# Patient Record
Sex: Male | Born: 1975 | Race: White | Hispanic: No | Marital: Single | Smoking: Never smoker
Health system: Southern US, Community
[De-identification: ages and names within clinical notes are randomized; demographics above are authoritative.]

## PROBLEM LIST (undated history)

## (undated) DIAGNOSIS — K219 Gastro-esophageal reflux disease without esophagitis: Secondary | ICD-10-CM

## (undated) HISTORY — PX: ADENOIDECTOMY: SUR15

## (undated) HISTORY — DX: Gastro-esophageal reflux disease without esophagitis: K21.9

---

## 2004-10-31 HISTORY — PX: OTHER SURGICAL HISTORY: SHX169

## 2004-11-30 ENCOUNTER — Ambulatory Visit: Payer: Self-pay | Admitting: Internal Medicine

## 2005-03-14 ENCOUNTER — Ambulatory Visit: Payer: Self-pay | Admitting: Internal Medicine

## 2005-03-23 ENCOUNTER — Ambulatory Visit: Payer: Self-pay | Admitting: Internal Medicine

## 2005-07-12 ENCOUNTER — Ambulatory Visit: Payer: Self-pay | Admitting: Internal Medicine

## 2005-07-13 ENCOUNTER — Ambulatory Visit: Payer: Self-pay | Admitting: Internal Medicine

## 2005-07-24 ENCOUNTER — Encounter: Admission: RE | Admit: 2005-07-24 | Discharge: 2005-07-24 | Payer: Self-pay | Admitting: Internal Medicine

## 2005-08-19 ENCOUNTER — Ambulatory Visit: Payer: Self-pay | Admitting: Internal Medicine

## 2005-11-28 ENCOUNTER — Ambulatory Visit: Payer: Self-pay | Admitting: Internal Medicine

## 2006-04-26 ENCOUNTER — Emergency Department (HOSPITAL_COMMUNITY): Admission: EM | Admit: 2006-04-26 | Discharge: 2006-04-26 | Payer: Self-pay | Admitting: Family Medicine

## 2006-06-07 ENCOUNTER — Ambulatory Visit: Payer: Self-pay | Admitting: Internal Medicine

## 2006-07-19 ENCOUNTER — Ambulatory Visit: Payer: Self-pay | Admitting: Internal Medicine

## 2007-03-06 ENCOUNTER — Emergency Department (HOSPITAL_COMMUNITY): Admission: EM | Admit: 2007-03-06 | Discharge: 2007-03-07 | Payer: Self-pay | Admitting: *Deleted

## 2007-06-07 ENCOUNTER — Encounter: Payer: Self-pay | Admitting: Internal Medicine

## 2008-09-18 ENCOUNTER — Ambulatory Visit: Payer: Self-pay | Admitting: Internal Medicine

## 2008-09-18 DIAGNOSIS — R634 Abnormal weight loss: Secondary | ICD-10-CM

## 2008-09-23 ENCOUNTER — Encounter (INDEPENDENT_AMBULATORY_CARE_PROVIDER_SITE_OTHER): Payer: Self-pay | Admitting: *Deleted

## 2008-09-23 ENCOUNTER — Ambulatory Visit: Payer: Self-pay | Admitting: Internal Medicine

## 2008-09-23 LAB — CONVERTED CEMR LAB
OCCULT 1: NEGATIVE
OCCULT 2: NEGATIVE
OCCULT 3: NEGATIVE

## 2008-10-01 ENCOUNTER — Encounter (INDEPENDENT_AMBULATORY_CARE_PROVIDER_SITE_OTHER): Payer: Self-pay | Admitting: *Deleted

## 2008-10-01 LAB — CONVERTED CEMR LAB
ALT: 23 units/L (ref 0–53)
Calcium: 9.7 mg/dL (ref 8.4–10.5)
Creatinine, Ser: 1 mg/dL (ref 0.4–1.5)
Free T4: 0.9 ng/dL (ref 0.6–1.6)
GFR calc non Af Amer: 91 mL/min
HCT: 42.6 % (ref 39.0–52.0)
HDL: 49 mg/dL (ref >39.0)
LDL Cholesterol: 67 mg/dL (ref 0–99)
Lymphocytes Relative: 28 % (ref 12.0–46.0)
MCHC: 34.7 g/dL (ref 30.0–36.0)
Monocytes Absolute: 0.4 10*3/uL (ref 0.1–1.0)
Monocytes Relative: 8.8 % (ref 3.0–12.0)
Platelets: 243 10*3/uL (ref 150–400)
RDW: 12.6 % (ref 11.5–14.6)
TSH: 3.29 microintl units/mL (ref 0.35–5.50)
Total Bilirubin: 1.1 mg/dL (ref 0.3–1.2)
Triglycerides: 58 mg/dL (ref 0–149)

## 2010-04-14 ENCOUNTER — Ambulatory Visit: Payer: Self-pay | Admitting: Internal Medicine

## 2010-11-28 LAB — CONVERTED CEMR LAB
Alkaline Phosphatase: 73 units/L (ref 39–117)
BUN: 9 mg/dL (ref 6–23)
Basophils Relative: 1.1 % (ref 0.0–3.0)
Bilirubin, Direct: 0.2 mg/dL (ref 0.0–0.3)
Chloride: 101 meq/L (ref 96–112)
Cholesterol: 128 mg/dL (ref 0–200)
Eosinophils Absolute: 0.2 10*3/uL (ref 0.0–0.7)
Eosinophils Relative: 3.3 % (ref 0.0–5.0)
GFR calc non Af Amer: 100.21 mL/min (ref >60)
Glucose, Bld: 89 mg/dL (ref 70–99)
HCT: 42.7 % (ref 39.0–52.0)
HDL: 38.6 mg/dL — ABNORMAL LOW (ref >39.00)
Hemoglobin: 14.8 g/dL (ref 13.0–17.0)
Lymphocytes Relative: 31.6 % (ref 12.0–46.0)
Monocytes Relative: 9.9 % (ref 3.0–12.0)
Neutrophils Relative %: 54.1 % (ref 43.0–77.0)
Potassium: 4.4 meq/L (ref 3.5–5.1)
TSH: 2.92 microintl units/mL (ref 0.35–5.50)
Triglycerides: 51 mg/dL (ref 0.0–149.0)
WBC: 5 10*3/uL (ref 4.5–10.5)

## 2010-11-30 NOTE — Assessment & Plan Note (Signed)
Summary: cpx/kdc   Vital Signs:  Patient profile:   35 year old male Height:      71 inches Weight:      134.6 pounds BMI:     18.84 Temp:     97.6 degrees F oral Pulse rate:   42 / minute Resp:     14 per minute BP sitting:   100 / 64  (left arm) Cuff size:   regular  Vitals Entered By: Shonna Chock (April 14, 2010 10:08 AM) CC: CPX with fasting labs  Comments No current meds  **Patient refused to update TD Vaccine**   CC:  CPX with fasting labs .  History of Present Illness: Marc Bates is here for a physical; he has had increased GI symptoms (gas & bloating) on high carb diet  to gain weight, increased wheat intake. Symtoms improved with change to & increased fruits & vegetables. He plans to reduce gluten intake.  Allergies (verified): No Known Drug Allergies  Past History:  Past Medical History: Condyloma peri rectally , Mirian Mo MD,[facility] 2006, S/P  post excision w/o recurrence  Past Surgical History: Colonoscopy 1994 for rectal bleeding : negative ([place]) ; Condylomata  excision 2006; Throat polypectomy 1988 ([place]); Wisdom Teeth Extraction  Family History: Father: negative Mother: negative Siblings: 3 BROTHERS: neg MGF: CVA; PGF: DM,DM PGM: liver disease  Social History: Occupation: Research scientist (medical) Never Smoked Alcohol use-yes: rarely Regular exercise-yes: [place] walking  & gym 2-3X/ week 30-60 min w/o symptoms  Review of Systems General:  Denies chills, fever, sleep disorder, and sweats; Weight is stabilizing. Eyes:  Denies blurring, discharge, double vision, eye pain, red eye, and vision loss-both eyes. ENT:  Denies difficulty swallowing and hoarseness. CV:  Denies chest pain or discomfort, leg cramps with exertion, palpitations, shortness of breath with exertion, swelling of feet, and swelling of hands. Resp:  Denies cough, shortness of breath, and sputum productive. GI:  See HPI; Denies abdominal pain, bloody stools, constipation, diarrhea, excessive  appetite, indigestion, loss of appetite, nausea, and vomiting. GU:  Denies discharge, dysuria, and hematuria. MS:  Denies joint pain, joint redness, joint swelling, low back pain, mid back pain, and thoracic pain. Derm:  Denies changes in nail beds, dryness, hair loss, lesion(s), poor wound healing, and rash. Neuro:  Denies numbness and tingling. Psych:  Denies anxiety, depression, easily angered, easily tearful, and irritability. Endo:  Denies cold intolerance, excessive hunger, excessive thirst, excessive urination, and heat intolerance. Heme:  Denies abnormal bruising and bleeding. Allergy:  Denies itching eyes and sneezing.  Physical Exam  General:  Thin but well-nourished,in no acute distress; alert,appropriate and cooperative throughout examination Head:  Normocephalic and atraumatic without obvious abnormalities.  Eyes:  No corneal or conjunctival inflammation noted. EOMI. Perrla. Funduscopic exam benign, without hemorrhages, exudates or papilledema. Vision grossly normal. Ears:  External ear exam shows no significant lesions or deformities.  Otoscopic examination reveals clear canals, tympanic membranes are intact bilaterally without bulging, retraction, inflammation or discharge. Hearing is grossly normal bilaterally. Nose:  External nasal examination shows no deformity or inflammation. Nasal mucosa are pink and moist without lesions or exudates. Mouth:  Oral mucosa and oropharynx without lesions or exudates.  Teeth in good repair. Neck:  No deformities, masses, or tenderness noted. Lungs:  Normal respiratory effort, chest expands symmetrically. Lungs are clear to auscultation, no crackles or wheezes. Heart:  regular rhythm, no murmur, no gallop, no rub, no JVD, no HJR, and bradycardia.  S4 @ apex Abdomen:  Bowel sounds positive,abdomen soft and  non-tender without masses, organomegaly or hernias noted. Rectal:  No external abnormalities noted. Normal sphincter tone. No rectal masses or  tenderness. Genitalia:  Testes bilaterally descended without nodularity, tenderness or masses. No scrotal masses or lesions. No penis lesions or urethral discharge.L varicocele.   Prostate:  Prostate gland firm and smooth, no enlargement, nodularity, tenderness, mass, asymmetry or induration. Msk:  No deformity or scoliosis noted of thoracic or lumbar spine.   Pulses:  R and L carotid,radial,dorsalis pedis and posterior tibial pulses are full and equal bilaterally Extremities:  No clubbing, cyanosis, edema, or deformity noted with normal full range of motion of all joints.   Neurologic:  alert & oriented X3 and DTRs symmetrical and normal.   Skin:  Intact without suspicious lesions or rashes Cervical Nodes:  No lymphadenopathy noted Axillary Nodes:  No palpable lymphadenopathy Inguinal Nodes:  No significant adenopathy Psych:  memory intact for recent and remote, normally interactive, and good eye contact.     Impression & Recommendations:  Problem # 1:  ROUTINE GENERAL MEDICAL EXAM@HEALTH  CARE FACL (ICD-V70.0)  Orders: Venipuncture (60454) TLB-Lipid Panel (80061-LIPID) TLB-BMP (Basic Metabolic Panel-BMET) (80048-METABOL) TLB-CBC Platelet - w/Differential (85025-CBCD) TLB-Hepatic/Liver Function Pnl (80076-HEPATIC) TLB-TSH (Thyroid Stimulating Hormone) (84443-TSH) EKG w/ Interpretation (93000)  Problem # 2:  WEIGHT LOSS (ICD-783.21)  Stabilizing with diet change  Orders: Venipuncture (09811)  Patient Instructions: 1)  Review " dietary Gluten " @ [facility] to help assess gluten intoerance possibility.

## 2011-11-01 HISTORY — PX: OTHER SURGICAL HISTORY: SHX169

## 2012-01-11 ENCOUNTER — Other Ambulatory Visit: Payer: Self-pay | Admitting: Specialist

## 2012-01-11 ENCOUNTER — Ambulatory Visit
Admission: RE | Admit: 2012-01-11 | Discharge: 2012-01-11 | Disposition: A | Discharge status: Home / Self Care | Payer: BC Managed Care – PPO | Source: Ambulatory Visit | Attending: Specialist | Admitting: Specialist

## 2012-01-12 ENCOUNTER — Ambulatory Visit: Payer: Self-pay | Admitting: Internal Medicine

## 2012-01-18 ENCOUNTER — Ambulatory Visit (INDEPENDENT_AMBULATORY_CARE_PROVIDER_SITE_OTHER): Payer: BC Managed Care – PPO | Admitting: Internal Medicine

## 2012-01-18 ENCOUNTER — Encounter: Payer: Self-pay | Admitting: Internal Medicine

## 2012-01-18 VITALS — BP 106/68 | HR 72 | Wt 127.0 lb

## 2012-01-18 DIAGNOSIS — R109 Unspecified abdominal pain: Secondary | ICD-10-CM

## 2012-01-18 DIAGNOSIS — R1084 Generalized abdominal pain: Secondary | ICD-10-CM

## 2012-01-18 DIAGNOSIS — Z1289 Encounter for screening for malignant neoplasm of other sites: Secondary | ICD-10-CM

## 2012-01-18 DIAGNOSIS — R634 Abnormal weight loss: Secondary | ICD-10-CM

## 2012-01-18 LAB — TSH: TSH: 2.86 u[IU]/mL (ref 0.35–5.50)

## 2012-01-18 LAB — CBC WITH DIFFERENTIAL/PLATELET
Eosinophils Absolute: 0.1 10*3/uL (ref 0.0–0.7)
Eosinophils Relative: 1.6 % (ref 0.0–5.0)
Lymphocytes Relative: 26.2 % (ref 12.0–46.0)
Monocytes Relative: 9.4 % (ref 3.0–12.0)
Neutrophils Relative %: 61.2 % (ref 43.0–77.0)
RBC: 5.34 Mil/uL (ref 4.22–5.81)

## 2012-01-18 LAB — BASIC METABOLIC PANEL
Calcium: 9.5 mg/dL (ref 8.4–10.5)
Chloride: 99 mEq/L (ref 96–112)
Glucose, Bld: 82 mg/dL (ref 70–99)

## 2012-01-18 LAB — HEPATIC FUNCTION PANEL
ALT: 19 U/L (ref 0–53)
Albumin: 4.5 g/dL (ref 3.5–5.2)
Bilirubin, Direct: 0 mg/dL (ref 0.0–0.3)

## 2012-01-18 NOTE — Progress Notes (Signed)
Subjective:    Patient ID: Marc Bates, male    DOB: July 23, 1976, 36 y.o.   MRN: [id number]  HPI He has lost 32 pounds over the last 3-4 years; 8 pounds in the last month. He was seen at an urgent care 01/10/12 for a "rock like" sensation in the mid abdomen which had been present for several days. Plain films of the abdomen were negative. He was told that his lab studies were "okay"  Past medical history, family history and social history were reviewed. His paternal grandmother had liver disease of unknown etiology. He is not sure whether this was malignant in nature. He has had anal warts resected at [facility].    Review of Systems Patient reports no  vision/ hearing changes,anorexia,  fever ,adenopathy, persistant / recurrent hoarseness, swallowing issues, chest pain,palpitations, edema,persistant / recurrent cough,sputum production , hemoptysis, dyspnea(rest, exertional, paroxysmal nocturnal), gastrointestinal  bleeding (melena, rectal bleeding),  excessive heart burn, GU symptoms( dysuria, hematuria, pyuria, voiding/incontinence  issues) syncope,  memory loss,numbness & tingling, skin/hair/nail changes,depression, anxiety, abnormal bruising/bleeding,or  musculoskeletal symptoms/signs.   He describes intermittent weakness in his upper extremities. He describes early satiety; he states "the stomach probably shrank" HIV was negative 3-4 years ago; no sexual exposures since by history.     Objective:   Physical Exam Gen.: Thin; suboptimally nourished in appearance. Alert, appropriate and cooperative throughout exam. Head: Normocephalic without obvious abnormalities Eyes: No corneal or conjunctival inflammation noted.  Extraocular motion intact w/o lid lag or proptosis. No icterus. Ears: External  ear exam reveals no significant lesions or deformities. Canals clear .TMs normal. Hearing is grossly normal bilaterally. Nose: External nasal exam reveals no deformity or inflammation. Nasal mucosa  are pink and moist. No lesions or exudates noted.  Mouth: Oral mucosa and oropharynx reveal no lesions or exudates. Teeth in good repair. Neck: No deformities, masses, or tenderness noted.  Thyroid normal Lungs: Normal respiratory effort; chest expands symmetrically. Lungs are clear to auscultation without rales, wheezes, or increased work of breathing. Heart: Normal rate and rhythm. Normal S1 and S2. No gallop, click, or rub. No murmur. Abdomen: Bowel sounds normal; abdomen soft and nontender. No masses, organomegaly or hernias noted. Genitalia:normal.                                                                                   Musculoskeletal/extremities: No deformity or scoliosis noted of  the thoracic or lumbar spine. No clubbing, cyanosis, edema, or deformity noted. Range of motion  normal .Tone & strength  normal.Joints normal. Nail health  good. Vascular: Carotid, radial artery, dorsalis pedis and  posterior tibial pulses are full and equal. No bruits present. Neurologic: Alert and oriented x3. Deep tendon reflexes symmetrical and normal.          Skin: Intact without suspicious lesions or rashes. Lymph: Minor posterior shotty  Cervical LA w/o  axillary, or inguinal lymphadenopathy present. Psych: Mood and affect are normal. Normally interactive  Assessment & Plan:  #1 profound weight loss without definite localizing signs or symptoms  #2 vague abdominal discomfort, resolved  Plan: I'll attempt to attempt to obtain labs from urgent care. Extensive testing to include full thyroid  function tests would be indicated. GI referral is indicated because of the abdominal symptoms and weight loss.

## 2012-01-18 NOTE — Patient Instructions (Signed)
Please complete stool cards. 

## 2012-01-19 ENCOUNTER — Other Ambulatory Visit: Payer: Self-pay | Admitting: Internal Medicine

## 2012-01-19 DIAGNOSIS — R634 Abnormal weight loss: Secondary | ICD-10-CM

## 2012-01-20 ENCOUNTER — Telehealth: Payer: Self-pay | Admitting: Internal Medicine

## 2012-01-20 ENCOUNTER — Encounter: Payer: Self-pay | Admitting: Gastroenterology

## 2012-01-20 NOTE — Telephone Encounter (Signed)
Left message on voicemail with Dr.Hopper's recommendations, patient to call if questions or concerns. Patient advised to sign up for mychart in the future to get quicker access to his results.

## 2012-01-20 NOTE — Telephone Encounter (Signed)
Patient calling, would like a call back with his lab results, even if they will be mailed to him, he wants the results by phone please.

## 2012-01-25 LAB — POC HEMOCCULT BLD/STL (OFFICE/1-CARD/DIAGNOSTIC): Fecal Occult Blood, POC: NEGATIVE

## 2012-01-25 NOTE — Progress Notes (Signed)
Addended by: Silvio Pate D on: 01/25/2012 04:44 PM   Modules accepted: Orders

## 2012-01-30 ENCOUNTER — Telehealth: Payer: Self-pay

## 2012-01-30 NOTE — Telephone Encounter (Signed)
msg from patient stating he wanted to get the results of his labs. Please advise     KP

## 2012-01-30 NOTE — Telephone Encounter (Signed)
Spoke with Pt who is requesting result of stool cards. Advise Pt these result are not back but will check with labs and give him a call once result in.

## 2012-01-31 NOTE — Telephone Encounter (Signed)
Left message on voicemail informing patient results were normal. Letter was mailed on the 28th

## 2012-02-01 ENCOUNTER — Ambulatory Visit: Payer: BC Managed Care – PPO | Admitting: Gastroenterology

## 2012-02-15 ENCOUNTER — Encounter: Payer: Self-pay | Admitting: Internal Medicine

## 2012-02-17 ENCOUNTER — Encounter: Payer: Self-pay | Admitting: Internal Medicine

## 2012-02-17 ENCOUNTER — Ambulatory Visit (INDEPENDENT_AMBULATORY_CARE_PROVIDER_SITE_OTHER): Payer: BC Managed Care – PPO | Admitting: Internal Medicine

## 2012-02-17 ENCOUNTER — Other Ambulatory Visit (INDEPENDENT_AMBULATORY_CARE_PROVIDER_SITE_OTHER): Payer: BC Managed Care – PPO

## 2012-02-17 VITALS — BP 90/62 | HR 72 | Ht 71.0 in | Wt 138.0 lb

## 2012-02-17 DIAGNOSIS — R6881 Early satiety: Secondary | ICD-10-CM

## 2012-02-17 DIAGNOSIS — R634 Abnormal weight loss: Secondary | ICD-10-CM

## 2012-02-17 DIAGNOSIS — K219 Gastro-esophageal reflux disease without esophagitis: Secondary | ICD-10-CM

## 2012-02-17 LAB — IGA: IgA: 465 mg/dL — ABNORMAL HIGH (ref 68–378)

## 2012-02-17 MED ORDER — PANTOPRAZOLE SODIUM 40 MG PO TBEC: 40.0000 mg | DELAYED_RELEASE_TABLET | Freq: Every day | ORAL | Status: DC

## 2012-02-17 NOTE — Patient Instructions (Signed)
Your physician has requested that you go to the basement for lab work before leaving today.  We have sent the following medications to your pharmacy for you to pick up at your convenience: Pantoprazole; take as directed.  Dr. Rhea Belton highly recommends an Endoscopy; Please call the office if symptoms worsen. [phone]  Follow up with Dr. Rhea Belton in 4 weeks

## 2012-02-17 NOTE — Progress Notes (Signed)
Subjective:    Patient ID: Marc Bates, male    DOB: 15-May-1976, 36 y.o.   MRN: [id number]  HPI Marc Bates is a 36 yo male with little to no PMH who is seen in consultation at the request of Dr. Alwyn Ren for evaluation of weight loss, early satiety.  The patient reports issues with weight loss over the last 2-3 years. He also has noted trouble with early satiety, which is caused him to dramatically change his diet. He is now eating very small meals and try to do sore more frequently. He reports he is no longer able to eat normal portion sizes for him.  He reports having lost 30 pounds, but with his small frequent meals has gained about 9 of these pounds back. He also reports issues with heartburn and epigastric discomfort which is been worse over the last few weeks to months. Because of this he has again modify his diet, but avoiding foods such as tomatoes and foods high in fat. This, in turn is making it difficult to gain more weight.  He denies dysphagia or odynophagia. He's had no nausea or vomiting. His bowel movements of been normal for him without melena or bright red blood per rectum. He does report occasional vague epigastric to left upper quadrant pain. This seems to be worse when he eats late at night. He also has noted increased belching, often of sour tasting fluid. No fevers or chills.  Review of Systems As per history of present illness otherwise negative  Patient Active Problem List  Diagnoses  . WEIGHT LOSS   Past Surgical History  Procedure Date  . Tonsillectomy and adenoidectomy   . Anal wart resection     Dr Byrd Hesselbach, [facility]   Current Outpatient Prescriptions  Medication Sig Dispense Refill  . PAPAYA PO Take 1 tablet by mouth daily.       No Known Allergies  Family History  Problem Relation Age of Onset  . Stroke Maternal Grandfather   . Stroke Paternal Grandfather   . Diabetes Paternal Grandfather   . Liver disease Paternal Grandmother     ? diagnosis   History    Substance Use Topics  . Smoking status: Never Smoker   . Smokeless tobacco: Never Used  . Alcohol Use: Yes      rarely      Objective:   Physical Exam BP 90/62  Pulse 72 Constitutional: Well-developed and well-nourished. No distress. HEENT: Normocephalic and atraumatic. Oropharynx is clear and moist. No oropharyngeal exudate. Conjunctivae are normal. Pupils are equal round and reactive to light. No scleral icterus. Neck: Neck supple. Trachea midline. Cardiovascular: Normal rate, regular rhythm and intact distal pulses. No M/R/G Pulmonary/chest: Effort normal and breath sounds normal. No wheezing, rales or rhonchi. Abdominal: Soft, thin, nontender, nondistended. Bowel sounds active throughout. There are no masses palpable. No hepatosplenomegaly. Extremities: no clubbing, cyanosis, or edema Lymphadenopathy: No cervical adenopathy noted. Neurological: Alert and oriented to person place and time. Skin: Skin is warm and dry. No rashes noted. Psychiatric: Normal mood and affect. Behavior is normal.  CBC    Component Value Date/Time   WBC 5.6 01/18/2012 1246   RBC 5.34 01/18/2012 1246   HGB 15.2 01/18/2012 1246   HCT 44.0 01/18/2012 1246   PLT 258.0 01/18/2012 1246   MCV 82.3 01/18/2012 1246   MCHC 34.6 01/18/2012 1246   RDW 12.9 01/18/2012 1246   LYMPHSABS 1.5 01/18/2012 1246   MONOABS 0.5 01/18/2012 1246   EOSABS 0.1 01/18/2012 1246  BASOSABS 0.1 01/18/2012 1246    CMP     Component Value Date/Time   NA 138 01/18/2012 1246   K 4.2 01/18/2012 1246   CL 99 01/18/2012 1246   CO2 32 01/18/2012 1246   GLUCOSE 82 01/18/2012 1246   BUN 11 01/18/2012 1246   CREATININE 0.7 01/18/2012 1246   CALCIUM 9.5 01/18/2012 1246   PROT 7.6 01/18/2012 1246   ALBUMIN 4.5 01/18/2012 1246   AST 13 01/18/2012 1246   ALT 19 01/18/2012 1246   ALKPHOS 71 01/18/2012 1246   BILITOT 0.4 01/18/2012 1246   GFRNONAA 100.21 04/14/2010 0000   GFRAA 110 09/18/2008 1043   TSH, T3, T4 -- WNL HIV neg     Assessment &  Plan:  Marc Bates is a 36 yo male with little to no PMH who is seen in consultation at the request of Dr. Alwyn Ren for evaluation of weight loss, early satiety.  Also with GERD  1. Weight loss/early satiety/GERD -- the patient has had a dramatic and unexplained weight loss along with early satiety. More recently he has had symptoms consistent with acid reflux disease. For his symptoms I recommended upper endoscopy. We discussed this test in detail including risks and benefits. Today he is hesitant to schedule this test, and has requested more time to think about this test. He knows that this is my recommendation. I also recommended PPI therapy, and we have discussed this medication a day. I do not think this will necessarily become a chronic medication for him, but I do think he could be helpful with his GERD symptoms. I recommended pantoprazole 40 mg daily, taken 30 minutes to one hour before breakfast. I like for him to try this for 4 weeks and then reassess his response. Again, I do not feel GERD explains his weight loss or early satiety and whole, thus the recommendation for EGD. His lab evaluation of this point has been unremarkable, and I will perform celiac panel today. I will schedule him back for 4 weeks' time to reassess his symptoms. He will think about EGD, and if he so decides, this can be scheduled in the interim without a repeat clinic visit. If the aforementioned workup is unrevealing, then I recommend cross-sectional imaging with CT scan of the abdomen and pelvis.

## 2012-02-20 ENCOUNTER — Telehealth: Payer: Self-pay | Admitting: Internal Medicine

## 2012-02-20 NOTE — Telephone Encounter (Signed)
Pt called to ask for celiac lab results. Informed him one result is not complete and hopefully it will be available in the next day or so. Pt then asked about an appt for an EGD. Informed pt we can see him Friday for this and he scheduled for 09:30am. He will call me back tomorrow if he can't find a ride and we will schedule a PV for him.

## 2012-02-21 NOTE — Telephone Encounter (Signed)
Informed pt his Celiac panel was negative. He will have EGD on 02/24/12 and a PV tomorrow.

## 2012-02-22 ENCOUNTER — Ambulatory Visit (AMBULATORY_SURGERY_CENTER): Payer: BC Managed Care – PPO

## 2012-02-22 ENCOUNTER — Telehealth: Payer: Self-pay

## 2012-02-22 ENCOUNTER — Telehealth: Payer: Self-pay | Admitting: Internal Medicine

## 2012-02-22 VITALS — Ht 71.0 in | Wt 132.9 lb

## 2012-02-22 DIAGNOSIS — K219 Gastro-esophageal reflux disease without esophagitis: Secondary | ICD-10-CM

## 2012-02-22 DIAGNOSIS — R6881 Early satiety: Secondary | ICD-10-CM

## 2012-02-22 DIAGNOSIS — R634 Abnormal weight loss: Secondary | ICD-10-CM

## 2012-02-22 NOTE — Telephone Encounter (Signed)
Pt had several questions concerning procedure and protocal.  Reviewed with pt and advised him to call us again.

## 2012-02-23 NOTE — Telephone Encounter (Signed)
See other note

## 2012-02-24 ENCOUNTER — Ambulatory Visit (AMBULATORY_SURGERY_CENTER): Payer: BC Managed Care – PPO | Admitting: Internal Medicine

## 2012-02-24 ENCOUNTER — Encounter: Payer: Self-pay | Admitting: Internal Medicine

## 2012-02-24 VITALS — BP 109/67 | HR 80 | Temp 98.3°F | Resp 16 | Ht 71.0 in | Wt 132.0 lb

## 2012-02-24 DIAGNOSIS — K2990 Gastroduodenitis, unspecified, without bleeding: Secondary | ICD-10-CM

## 2012-02-24 DIAGNOSIS — K2970 Gastritis, unspecified, without bleeding: Secondary | ICD-10-CM

## 2012-02-24 DIAGNOSIS — R6881 Early satiety: Secondary | ICD-10-CM

## 2012-02-24 DIAGNOSIS — R634 Abnormal weight loss: Secondary | ICD-10-CM

## 2012-02-24 DIAGNOSIS — K3189 Other diseases of stomach and duodenum: Secondary | ICD-10-CM

## 2012-02-24 DIAGNOSIS — K219 Gastro-esophageal reflux disease without esophagitis: Secondary | ICD-10-CM

## 2012-02-24 DIAGNOSIS — K319 Disease of stomach and duodenum, unspecified: Secondary | ICD-10-CM

## 2012-02-24 MED ORDER — SODIUM CHLORIDE 0.9 % IV SOLN: 500.0000 mL | INTRAVENOUS | Status: DC

## 2012-02-24 NOTE — Patient Instructions (Signed)
Discharge instructions given with verbal understanding. Handouts on asophagistis and a hiatal hernia given. Resume previous medications.YOU HAD AN ENDOSCOPIC PROCEDURE TODAY AT THE [facility]: Refer to the procedure report that was given to you for any specific questions about what was found during the examination.  If the procedure report does not answer your questions, please call your gastroenterologist to clarify.  If you requested that your care partner not be given the details of your procedure findings, then the procedure report has been included in a sealed envelope for you to review at your convenience later.  YOU SHOULD EXPECT: Some feelings of bloating in the abdomen. Passage of more gas than usual.  Walking can help get rid of the air that was put into your GI tract during the procedure and reduce the bloating. If you had a lower endoscopy (such as a colonoscopy or flexible sigmoidoscopy) you may notice spotting of blood in your stool or on the toilet paper. If you underwent a bowel prep for your procedure, then you may not have a normal bowel movement for a few days.  DIET: Your first meal following the procedure should be a light meal and then it is ok to progress to your normal diet.  A half-sandwich or bowl of soup is an example of a good first meal.  Heavy or fried foods are harder to digest and may make you feel nauseous or bloated.  Likewise meals heavy in dairy and vegetables can cause extra gas to form and this can also increase the bloating.  Drink plenty of fluids but you should avoid alcoholic beverages for 24 hours.  ACTIVITY: Your care partner should take you home directly after the procedure.  You should plan to take it easy, moving slowly for the rest of the day.  You can resume normal activity the day after the procedure however you should NOT DRIVE or use heavy machinery for 24 hours (because of the sedation medicines used during the test).    SYMPTOMS TO REPORT  IMMEDIATELY: A gastroenterologist can be reached at any hour.  During normal business hours, 8:30 AM to 5:00 PM Monday through Friday, call [phone].  After hours and on weekends, please call the GI answering service at [phone] who will take a message and have the physician on call contact you.   Following upper endoscopy (EGD)  Vomiting of blood or coffee ground material  New chest pain or pain under the shoulder blades  Painful or persistently difficult swallowing  New shortness of breath  Fever of 100F or higher  Black, tarry-looking stools  FOLLOW UP: If any biopsies were taken you will be contacted by phone or by letter within the next 1-3 weeks.  Call your gastroenterologist if you have not heard about the biopsies in 3 weeks.  Our staff will call the home number listed on your records the next business day following your procedure to check on you and address any questions or concerns that you may have at that time regarding the information given to you following your procedure. This is a courtesy call and so if there is no answer at the home number and we have not heard from you through the emergency physician on call, we will assume that you have returned to your regular daily activities without incident.  SIGNATURES/CONFIDENTIALITY: You and/or your care partner have signed paperwork which will be entered into your electronic medical record.  These signatures attest to the fact that that the  information above on your After Visit Summary has been reviewed and is understood.  Full responsibility of the confidentiality of this discharge information lies with you and/or your care-partner.

## 2012-02-24 NOTE — Progress Notes (Signed)
Patient did not experience any of the following events: a burn prior to discharge; a fall within the facility; wrong site/side/patient/procedure/implant event; or a hospital transfer or hospital admission upon discharge from the facility. (G8907) Patient did not have preoperative order for IV antibiotic SSI prophylaxis. (G8918)  

## 2012-02-24 NOTE — Op Note (Signed)
[facility] [address] [place], [place]  [address]  ENDOSCOPY PROCEDURE REPORT  PATIENT:  Marc, Bates  MR#:  [id number] BIRTHDATE:  [birth year], 35 yrs. old  GENDER:  male ENDOSCOPIST:  Carie Caddy. Rainna Nearhood, MD  PROCEDURE DATE:  02/24/2012 PROCEDURE:  EGD with biopsy, 43239 ASA CLASS:  Class I INDICATIONS:  GERD, weight loss, early satiety MEDICATIONS:   These medications were titrated to patient response per physician's verbal order, Fentanyl 50 mcg IV, Versed 8 mg IV TOPICAL ANESTHETIC:  Cetacaine Spray  DESCRIPTION OF PROCEDURE:   After the risks benefits and alternatives of the procedure were thoroughly explained, informed consent was obtained.  The LB [id number] endoscope was introduced through the mouth and advanced to the second portion of the duodenum, without limitations.  The instrument was slowly withdrawn as the mucosa was fully examined. <<PROCEDUREIMAGES>>  Mild LA Grade A esophagitis was found at the gastroesophageal junction.  Otherwise normal esophagus.  A hiatal hernia was found. A nodule was found in the distal body of the stomach. Multiple biopsies were obtained and sent to pathology.  Mild gastritis was found in the body and the antrum of the stomach. Biopsies of the antrum and body of the stomach were obtained and sent to pathology.  The duodenal bulb was normal in appearance, as was the postbulbar duodenum.    Retroflexed views revealed findings as previously described.    The scope was then withdrawn from the patient and the procedure completed.  COMPLICATIONS:  None  ENDOSCOPIC IMPRESSION: 1) Mild esophagitis at the gastroesophageal junction. 2) Otherwise normal esophagus 3) Small hiatal hernia 4) Nodule in the body of the stomach.  Multiple biopsies taken.  5) Mild gastritis in the body and the antrum of the stomach. Biopsies taken for H. Pylori. 6) Normal duodenum  RECOMMENDATIONS: 1) Await pathology results 2) Follow-up of  helicobacter pylori status, treat if indicated 3) Continue taking your PPI (antiacid medicine) once daily. It is best to be taken 20-30 minutes prior to breakfast meal.  Carie Caddy. Rhea Belton, MD  CC:  The Patient Pecola Lawless, MD  n. Rosalie DoctorCarie Caddy. Misty Rago at 02/24/2012 10:34 AM  Meta Hatchet, [phone]

## 2012-02-27 ENCOUNTER — Telehealth: Payer: Self-pay

## 2012-02-27 NOTE — Telephone Encounter (Signed)
Left message on answering machine. 

## 2012-02-29 ENCOUNTER — Encounter: Payer: Self-pay | Admitting: Internal Medicine

## 2012-03-05 ENCOUNTER — Telehealth: Payer: Self-pay | Admitting: Internal Medicine

## 2012-03-06 NOTE — Telephone Encounter (Signed)
lmom for pt to call back

## 2012-03-06 NOTE — Telephone Encounter (Signed)
i have seen him, egd was done. Letter was mailed 02/29/12. He was to start pantoprazole 40 mg daily and follow with me in clinic in one month

## 2012-03-07 ENCOUNTER — Telehealth: Payer: Self-pay | Admitting: Internal Medicine

## 2012-03-07 NOTE — Telephone Encounter (Signed)
Pt has questions for Dr Rhea Belton. 1) Is it OK to take B12, Herbal tea, Magnesium, Licorice Root Extract, Pancreatine, Calcium and Papaya with the pantoprazole? 2) What diet should he follow- GERD or regular? When he tried white grape juice he got reflux; will try apple juice next. He wanted to discuss his HH but I told him to wait until he saw Dr Rhea Belton. Thanks.

## 2012-03-07 NOTE — Telephone Encounter (Signed)
See phone encounter from 03/05/12.

## 2012-03-08 NOTE — Telephone Encounter (Signed)
1. Those meds should be okay 2. GERD diet until medication is effective, then may be able to liberalize diet a bit 3. We will discuss HH in clinic, this is a minor issue and he should not worry about it

## 2012-03-08 NOTE — Telephone Encounter (Signed)
Informed pt of findings and suggestions by Dr Rhea Belton. Pt wants to know if he can double up on the pantoprazole? Please advise. Thanks.

## 2012-03-09 NOTE — Telephone Encounter (Signed)
lmom for pt to call back

## 2012-03-09 NOTE — Telephone Encounter (Signed)
Informed pt Dr Rhea Belton stated he can take Pantoprazole BID before meals until f/u on 03/21/12. Pt asked about feeling raw in his GI system when he overeats; pt is trying to main weight or at least not lose more. We discussed foods high in calories, but they are all listed for him not to try d/t reflux diet. He isn't sure about whether he has lactose intolerance, but he does eat tofu and yogurt. Suggested he try adding things to his protein drinks like peanut butter, chocolate syrup, etc. I will call him Monday.

## 2012-03-09 NOTE — Telephone Encounter (Signed)
pantoprazole 40 mg BID-AC until followup

## 2012-03-14 ENCOUNTER — Telehealth: Payer: Self-pay | Admitting: Internal Medicine

## 2012-03-14 NOTE — Telephone Encounter (Signed)
Pt reports on some days he has breakthrough reflux in between the bid pantoprazole. Pt wants to know if he can take TUMS or Pepcid for breakthrough GERD or try a different PPI? Please advise. Thanks.

## 2012-03-14 NOTE — Telephone Encounter (Signed)
See 03/14/12 encounter.

## 2012-03-14 NOTE — Telephone Encounter (Signed)
Informed pt he can take the [facility] for breakthru pain, then after questioning further, he has not started the BID pantoprazole. Instructed pt to take the pantoprazole AC and if he gets breakthru reflux, take TUMS and keep a record of how often this is happening; pt stated understanding.

## 2012-03-14 NOTE — Telephone Encounter (Signed)
TUMS okay for breakthrough symptoms, however if symptoms recur in between doses on a regular basis, we'll likely switch to a different PPI. We'll discuss this at followup

## 2012-03-20 ENCOUNTER — Encounter: Payer: Self-pay | Admitting: Internal Medicine

## 2012-03-21 ENCOUNTER — Ambulatory Visit (INDEPENDENT_AMBULATORY_CARE_PROVIDER_SITE_OTHER): Payer: BC Managed Care – PPO | Admitting: Internal Medicine

## 2012-03-21 ENCOUNTER — Encounter: Payer: Self-pay | Admitting: Internal Medicine

## 2012-03-21 VITALS — BP 88/60 | HR 80 | Ht 71.0 in | Wt 136.1 lb

## 2012-03-21 DIAGNOSIS — F411 Generalized anxiety disorder: Secondary | ICD-10-CM

## 2012-03-21 DIAGNOSIS — R1013 Epigastric pain: Secondary | ICD-10-CM

## 2012-03-21 DIAGNOSIS — K219 Gastro-esophageal reflux disease without esophagitis: Secondary | ICD-10-CM

## 2012-03-21 DIAGNOSIS — F419 Anxiety disorder, unspecified: Secondary | ICD-10-CM

## 2012-03-21 DIAGNOSIS — Z8719 Personal history of other diseases of the digestive system: Secondary | ICD-10-CM

## 2012-03-21 MED ORDER — DEXLANSOPRAZOLE 60 MG PO CPDR: 60.0000 mg | DELAYED_RELEASE_CAPSULE | Freq: Every day | ORAL | Status: DC

## 2012-03-21 NOTE — Patient Instructions (Signed)
You have been scheduled for an Abdominal Ultrasound at [facility] on 03/27/2012  @ 9:00am. Please arrive 15 min early. Nothing to eat or drink 6 hours prior to your appointment.  If you cannot make this appointment Please call radiology at [phone]  Stop taking Protonix.  We have sent the following medications to your pharmacy for you to pick up at your convenience: Dexilant  Once you are taking Dexilant, it is ok to Liberalize your diet.

## 2012-03-21 NOTE — Progress Notes (Signed)
Subjective:    Patient ID: Marc Bates, male    DOB: Nov 08, 1975, 36 y.o.   MRN: [id number]  HPI Marc Bates is a 36 yo male with little to no PMH who is seen in followup. He was seen initially for evaluation of weight loss, early satiety, along with heartburn. He subsequently underwent upper endoscopy on 02/24/2012 which revealed mild esophagitis, small hiatal hernia, and mild gastritis with a gastric nodule. Biopsies were negative for H. pylori, and a gastric nodule was not dysplastic. He was started on pantoprazole therapy 40 mg daily. He's been taking this once in the a.m. He reports today that he has switched to a complete and very strict anti-reflux diet. He notes that when he is very strict his heartburn is significantly better control. He does feel somewhat restricted by this diet. He specifically avoids overeating, he tries remain in the upright position after eating and drinking. His symptoms are worse with fatty food. He does continue to occasionally have regurgitation occasionally at night. He also occasionally has globus sensation. He denies abdominal pain today, and he's had no nausea or vomiting. He has been able to gain approximately 4 pounds since his last visit with me. However, he still continues to have some nocturnal heartburn issues. He asks questions about exercise and how this relates to his reflux disease. He also is concerned about not being able to he fatty foods and therefore his inability to gain weight. He also asks about intermittent issues with anxiety and if there is any medication that could be prescribed to help him relax somewhat.  Review of Systems As per history of present illness, otherwise negative  Current Medications, Allergies, Past Medical History, Past Surgical History, Family History and Social History were reviewed in [employer] record.     Objective:   Physical Exam BP 88/60  Pulse 80  Ht 5\' 11"  (1.803 m)  Wt 136 lb 2 oz  (61.746 kg)  BMI 18.99 kg/m2 Constitutional: Well-developed and well-nourished. No distress. HEENT: Normocephalic and atraumatic. Oropharynx is clear and moist. No oropharyngeal exudate. Conjunctivae are normal. Pupils are equal round and reactive to light. No scleral icterus. Neck: Neck supple. Trachea midline. Cardiovascular: Normal rate, regular rhythm and intact distal pulses. No M/R/G Pulmonary/chest: Effort normal and breath sounds normal. No wheezing, rales or rhonchi. Abdominal: Soft, nontender, nondistended. Bowel sounds active throughout. There are no masses palpable. No hepatosplenomegaly. Extremities: no clubbing, cyanosis, or edema Lymphadenopathy: No cervical adenopathy noted. Neurological: Alert and oriented to person place and time. Skin: Skin is warm and dry. No rashes noted. Psychiatric: Normal mood and affect. Behavior is normal.  Prior endoscopy, pathology, and laboratory data reviewed in his electronic medical    Assessment & Plan:  Marc Bates is a 36 yo male with little to no PMH who is seen in followup for heartburn, and dyspepsia. He had previously had weight loss, though fortunately has been able to gain approximately 4 pounds.  1. GERD/dyspepsia -- 8 some improvement and has been able to gain weight, all of which is encouraging. However he still continues to have issues with heartburn, despite pantoprazole 40 mg daily. I do think this is acid related disease, given the inflammation seen in his distal esophagus and stomach at recent endoscopy. Would like to try to modify his medication regimen, so that he can be a little less restrictive on his diet. This would likely also allow him to gain additional weight, which is his desire. I will discontinue pantoprazole,  and prescribed Dexilant 60 mg daily. Hopefully the longer acting nature of this medication will provide him with more benefit. I've instructed that it nocturnal symptoms continue to be the most troublesome, he  can move this dose of Dexilant to 30 minutes to one hour prior to his last meal of the day. He voices understanding. I will also order a complete abdominal ultrasound given his somewhat persistent symptoms.  2. Anxiety -- we discussed possibilities, and I would like to avoid benzodiazepines due to their habit-forming nature. Potential options include buspirone or low-dose SSRI such as Zoloft or Paxil. He'll think about this, he can also discuss this with Dr. Alwyn Ren.  Return in 6 weeks

## 2012-03-27 ENCOUNTER — Ambulatory Visit (HOSPITAL_COMMUNITY)
Admission: RE | Admit: 2012-03-27 | Discharge: 2012-03-27 | Disposition: A | Discharge status: Home / Self Care | Payer: BC Managed Care – PPO | Source: Ambulatory Visit | Attending: Internal Medicine | Admitting: Internal Medicine

## 2012-03-27 DIAGNOSIS — R1013 Epigastric pain: Secondary | ICD-10-CM | POA: Insufficient documentation

## 2012-04-04 ENCOUNTER — Ambulatory Visit (HOSPITAL_COMMUNITY): Payer: BC Managed Care – PPO | Admitting: Psychology

## 2012-04-10 ENCOUNTER — Encounter: Payer: BC Managed Care – PPO | Attending: Internal Medicine | Admitting: Dietician

## 2012-04-10 VITALS — Ht 71.0 in | Wt 136.6 lb

## 2012-04-10 DIAGNOSIS — K219 Gastro-esophageal reflux disease without esophagitis: Secondary | ICD-10-CM | POA: Insufficient documentation

## 2012-04-10 DIAGNOSIS — R634 Abnormal weight loss: Secondary | ICD-10-CM

## 2012-04-10 DIAGNOSIS — Z713 Dietary counseling and surveillance: Secondary | ICD-10-CM | POA: Insufficient documentation

## 2012-04-10 DIAGNOSIS — Z8719 Personal history of other diseases of the digestive system: Secondary | ICD-10-CM

## 2012-04-10 NOTE — Patient Instructions (Signed)
   Continue to mix the protein powders throughout the day.  Continue with rice milk and later add slowly the coconut milk.  Add the protein powder to soups and smoothies.   At some point have the MD have a look at your vitamin B12, magnesium, zinc. Other B vitamins.  Continue to use the high protein,  Organic milk.   Need to use the carbohydrates.  And, as your intestinal tract heals add some protein and some fat.  Protein can be the vegetable protein, chicken, try the less fatty fish (cod, flounder, or tilapia)  Fruit apples, digestion of cooked would be easier.  Baking the apples or making the hot applesauce which you can add some honey or syrup.   At the applesauce snack, add/consider a graham cracker or vanilla wafer or start to add the oatmeal cookies.  When making the oatmeal, use the rice or soy milk, and add protein powder and add the dates or other fruit.    Try to add a slice of the avocado to meals, to the sandwich, to hummas.    Continue to eat the frequent small meals and snacks that are high in carb  Keep the protein at 70 + grams each day.   Use cooked foods avoid raw foods except for some fruits (grapes, berries added to the smoothie).  Then start to add some veggies to the broth and then when adding veggies, add the softer veggies like the raw baby spinach and the [place] lettuce.  Total liquid at 1500 ml or 6-7 cups.per day (This includes soups, smoothie)

## 2012-04-10 NOTE — Progress Notes (Signed)
Medical Nutrition Therapy:  Appt start time: 1130 end time:  1230.   Assessment:  Primary concerns today: Recent history of sever weight loss accompanied with symptoms of sever heart burn.  Underwent a recent endoscopy (02/24/2012) and was found to have mild esophagitis, a small hiatal hernia and mild gastritis with a gastric nodule.  Accompanying the heart burn symptoms were feelings of early satiety and weight loss.  He relates that he has for sometime been a vegan and had decided to go to all raw vegan foods.  It was soon there after that he started to have some digestive issues.  It culminated following a spicy meal at a [place].  Following symptom development, he has placed himself on a regimen of trying to eat about 2000 calories per day using carbohydrates and very little vegetable fat and a occasional serving of chicken.  He continues to have feelings of early satiety, and is almost eating or drinking an organic protein drink most of the day (6-7 AM -8:00 PM).  He tries to not eat past 8:00 PM to prevent the development of nocturnal heart burn.  He was seeking confirmation that his current interventions were appropriate and any other ideas that I could provide to help him with regaining some weight in a safe and symptom free manner.  Since his appointment with Dr.l Pyrtle (03/21/2012), he has gained about 0.4 lb. He initially weight at 152-154 lb.  Over the last 3 years, he had lost down to 121 with his illness.  His desire is to get back to a weight of 150-155 lb.  Currently, he is 136.6 lb with a BMI of 19.1 kg/m2.  MEDICATIONS: Currently discontinued all meds except the Dexilant.   DIETARY INTAKE:  Usual eating pattern includes multiple small meals and multiple small snacks per day.  Everyday foods include vegetables and a small serving of fruit.  Avoided foods include fat and fatty foods and all animal protein except chicken.    24-hr recall:  B ( AM): 9:00 Am Bagel after the  medication 320 calorie (Sproted Wheat and Sesame Bagel) with almond butter and jam.  Drink 1/2 cup of milk (rice) and protein powder.1/4 scoop, 4-5 gm protein,  Snk ( AM): 11:30-12:00 (eating every 2.5-3 hrs.) Cereal (whole grain and organic ) 1 cup and rice milk 8 oz and maybe some grapes (60 calories).  L ( PM): 2:00 Pea soup 1 can  (280 calorie calories) and 2 gm fat. Add the nutritional yeast for B12 thav 2 slices of bread a small serving of margarine, (non-dairy) non-fat hummas 500-600.  Drink water. Snk ( PM): 5:30-6:00 Smoothie of yogutr and ricemilk1/3  and fruit or banana or berries, frozen berry mix [place] 1 scoop of protein powder 1-2 tab.  Total 8-10 oz  9450 calories) D ( PM): 8:30 vegetable mix and broth canola oil using 1 tablespoon with chicken broth with peas and carrot and peppers or celery. Tofu (wheat based) and potatoes.  (have ither potato, rice or pasta or [place], Sometimes a chicken breast a protein.  Chicken twice a week.  Snk ( PM): 10-1:30 drink 1 cup of tea or water. Beverages: Has to watch what he drinks after dinner.  Will get the acid reflux   Usual physical activity: walk daily average 1 hour.  Sometimes tryies to do push-up but too hard on the stomach.  Estimated energy needs:Ht: 71 in  WT: 136.6 lb (62 kg)  BMI: 19.1 kg/m2   2100-2200 calories  62 X 35 calories for weight gain 75 g protein 58-60 g fat (Plant based fat primarily)  Progress Towards Goal(s):  In progress.   Nutritional Diagnosis:  [place]-3.1 Underweight As related to weight loss accompanying symptoms of sever heart burn and previous use on raw vegan diet.  As evidenced by weight loss to 121 lb and current weight of 136.3 with a BMI of 19.1 kg/m2.    Intervention:  Nutrition Review of interventions to facilitate weight gain without evoking symptoms of heartburn..  Handouts given during visit include:  Visit summary suggestions.  Monitoring/Evaluation:  Dietary intake, exercise, and body weight to  return in 12 weeks if not gaining weight.  In the meantime is to contact me by e-mail to advise me of the progress with the suggested interventions.

## 2012-04-11 ENCOUNTER — Encounter: Payer: Self-pay | Admitting: Dietician

## 2012-04-15 ENCOUNTER — Encounter: Payer: Self-pay | Admitting: Dietician

## 2012-06-18 ENCOUNTER — Telehealth: Payer: Self-pay | Admitting: Internal Medicine

## 2012-06-18 NOTE — Telephone Encounter (Signed)
Pt is only 60mg  Dexilant daily and doing well. Pt wants to know if he can switch to pantoprazole " at a lower dose" and if that works out, cut the tablet in half for a lower dose. Explained to pt he may try this, but his s&s may recur; he'll just have to see how his body reacts. We discussed PPI's vs acid blockers and I cautioned him he tried to switch from Dexilant months ago and had trouble. Pt will keep [place] informed.

## 2012-07-11 ENCOUNTER — Telehealth: Payer: Self-pay | Admitting: Internal Medicine

## 2012-07-12 NOTE — Telephone Encounter (Signed)
Pt with hx of Epigastric Pain, GERD, Esophagitis, and Anxiety, last seen 03/21/12. He called later and wanted to switch from Dexilant to Pantoprazole. He saw a Dietician on 04/10/12 for advice on gaining weight. lmom for pt to call back.

## 2012-07-13 ENCOUNTER — Other Ambulatory Visit: Payer: Self-pay | Admitting: Gastroenterology

## 2012-07-13 MED ORDER — PANTOPRAZOLE SODIUM 40 MG PO TBEC: 40.0000 mg | DELAYED_RELEASE_TABLET | Freq: Every day | ORAL | Status: DC

## 2012-07-19 NOTE — Telephone Encounter (Signed)
Pt never called back.

## 2012-07-23 ENCOUNTER — Encounter: Payer: Self-pay | Admitting: Internal Medicine

## 2012-07-24 ENCOUNTER — Encounter: Payer: Self-pay | Admitting: Internal Medicine

## 2012-07-24 ENCOUNTER — Other Ambulatory Visit (INDEPENDENT_AMBULATORY_CARE_PROVIDER_SITE_OTHER): Payer: BC Managed Care – PPO

## 2012-07-24 ENCOUNTER — Ambulatory Visit (INDEPENDENT_AMBULATORY_CARE_PROVIDER_SITE_OTHER): Payer: BC Managed Care – PPO | Admitting: Internal Medicine

## 2012-07-24 VITALS — BP 90/62 | HR 66 | Ht 71.0 in | Wt 129.2 lb

## 2012-07-24 DIAGNOSIS — R140 Abdominal distension (gaseous): Secondary | ICD-10-CM

## 2012-07-24 DIAGNOSIS — R109 Unspecified abdominal pain: Secondary | ICD-10-CM

## 2012-07-24 DIAGNOSIS — R1013 Epigastric pain: Secondary | ICD-10-CM

## 2012-07-24 DIAGNOSIS — R634 Abnormal weight loss: Secondary | ICD-10-CM

## 2012-07-24 DIAGNOSIS — R141 Gas pain: Secondary | ICD-10-CM

## 2012-07-24 DIAGNOSIS — K219 Gastro-esophageal reflux disease without esophagitis: Secondary | ICD-10-CM

## 2012-07-24 LAB — COMPREHENSIVE METABOLIC PANEL
ALT: 16 U/L (ref 0–53)
AST: 13 U/L (ref 0–37)
CO2: 29 mEq/L (ref 19–32)
Chloride: 103 mEq/L (ref 96–112)
Creatinine, Ser: 0.9 mg/dL (ref 0.4–1.5)
Sodium: 146 mEq/L — ABNORMAL HIGH (ref 135–145)
Total Bilirubin: 0.8 mg/dL (ref 0.3–1.2)
Total Protein: 7 g/dL (ref 6.0–8.3)

## 2012-07-24 LAB — VITAMIN B12: Vitamin B-12: 497 pg/mL (ref 211–911)

## 2012-07-24 LAB — CBC
HCT: 42.1 % (ref 39.0–52.0)
Hemoglobin: 13.9 g/dL (ref 13.0–17.0)
MCHC: 33 g/dL (ref 30.0–36.0)
RDW: 13.8 % (ref 11.5–14.6)
WBC: 4.1 10*3/uL — ABNORMAL LOW (ref 4.5–10.5)

## 2012-07-24 LAB — IBC PANEL: Saturation Ratios: 22.7 % (ref 20.0–50.0)

## 2012-07-24 NOTE — Patient Instructions (Addendum)
  Your physician has requested that you go to the basement for  lab work before leaving today:   You have been scheduled for a CT scan of the abdomen and pelvis at [facility] ([address]---this is in the same building as Architectural technologist).   You are scheduled on 07/31/2012 at 9:00am. You should arrive 15 minutes prior to your appointment time for registration. Please follow the written instructions below on the day of your exam:  WARNING: IF YOU ARE ALLERGIC TO IODINE/X-RAY DYE, PLEASE NOTIFY RADIOLOGY IMMEDIATELY AT [phone]! YOU WILL BE GIVEN A 13 HOUR PREMEDICATION PREP.  1) Do not eat or drink anything after 5:00am (4 hours prior to your test) 2) You have been given 2 bottles of oral contrast to drink. The solution may taste better if refrigerated, but do NOT add ice or any other liquid to this solution. Shake  well before drinking.    Drink 1 bottle of contrast @ 7:00 (2 hours prior to your exam)  Drink 1 bottle of contrast @ 8:00 (1 hour prior to your exam)  You may take any medications as prescribed with a small amount of water except for the following: Metformin, Glucophage, Glucovance, Avandamet, Riomet, Fortamet, Actoplus Met, Janumet, Glumetza or Metaglip. The above medications must be held the day of the exam AND 48 hours after the exam.  The purpose of you drinking the oral contrast is to aid in the visualization of your intestinal tract. The contrast solution may cause some diarrhea. Before your exam is started, you will be given a small amount of fluid to drink. Depending on your individual set of symptoms, you may also receive an intravenous injection of x-ray contrast/dye. Plan on being at [facility] for 30 minutes or long, depending on the type of exam you are having performed.  If you have any questions regarding your exam or if you need to reschedule, you may call the CT department at [phone] between the hours of 8:00 am and 5:00 pm,  Monday-Friday.  Start to [facility].  Stay on Dexilant.   ________________________________________________________________________

## 2012-07-24 NOTE — Progress Notes (Signed)
Subjective:    Patient ID: Marc Bates, male    DOB: [birth year], 36 y.o.   MRN: [id number]  HPI Marc Bates is a 36 yo male with PMH GERD with dyspepsia and anxiety who is seen in followup. He was seen initially for evaluation of weight loss, early satiety, along with heartburn. He subsequently underwent upper endoscopy on 02/24/2012 which revealed mild esophagitis, small hiatal hernia, and mild gastritis with a gastric nodule. Biopsies were negative for H. pylori, and a gastric nodule was not dysplastic. He was having issues with weight loss and was seen by nutrition in the summer of 2013. He returns today feeling well, but reports she's had additional weight loss. When he was first seen here his weight had dropped to the low 120s, but had increased in the late spring early summer to 136. Today he weighs 129, and thus has lost 7 additional pounds. He is unsure why he is lost weight, and overall has felt well. He reports his appetite is good, but he continues a low fat diet to try to prevent reflux. He denies nausea and vomiting. His dominant does have been normal, formed and brown without blood or melena. He does occasionally report feeling bloated with borborygmi, but this seems to be worse after drinking fluids. He does not have abdominal pain. He denies heartburn and is not having dysphagia or odynophagia. No fevers or chills. No rashes. No night sweats, or other B. symptoms  Review of Systems As per history of present illness, otherwise negative  Current Medications, Allergies, Past Medical History, Past Surgical History, Family History and Social History were reviewed in [employer] record.     Objective:   Physical Exam BP 90/62  Pulse 66  Ht 5\' 11"  (1.803 m)  Wt 129 lb 3.2 oz (58.605 kg)  BMI 18.02 kg/m2  SpO2 99% Constitutional: Well-developed and well-nourished. No distress. HEENT: Normocephalic and atraumatic. Oropharynx is clear and moist. No  oropharyngeal exudate. Conjunctivae are normal.  No scleral icterus. Cardiovascular: Normal rate, regular rhythm and intact distal pulses. No M/R/G Pulmonary/chest: Effort normal and breath sounds normal. No wheezing, rales or rhonchi. Abdominal: Soft, nontender, nondistended. Bowel sounds active throughout. There are no masses palpable. No hepatosplenomegaly. Extremities: no clubbing, cyanosis, or edema Neurological: Alert and oriented to person place and time. Skin: Skin is warm and dry. No rashes noted. Psychiatric: Normal mood and affect. Behavior is normal.  EMR reviewed including prior US imaging and labs testing (thyroid, blood counts, metabolic panel, celiac panel)     Assessment & Plan:  Marc Bates is a 36 yo male with PMH GERD with dyspepsia and anxiety who is seen in followup  1.  Weight loss/abd bloating -- the patient's weight loss is unclear to me. Overall his symptoms are better than when he was first seen in our clinic. He is having some abdominal bloating, but not much pain. He has had upper endoscopy, the findings of which are documented in the history of present illness, and an ultrasound which was unremarkable. At this time given his unexplained weight loss, I recommended CT scan of the abdomen and pelvis. This will be done with contrast, and I will repeat lab studies today to include CBC, CMP, and iron stores. At his request, an ova and parasite examination will be performed on the stool to exclude parasitic infection. If CT scan is normal, I recommend empiric treatment for small intestinal bacterial overgrowth. Finally, I would like him to resume a regular diet  and not attempt to eat low fat only.  I think the pantoprazole will prevent GERD symptoms regardless of diet, and I feel that he needs the additional fat intake given his weight loss.  2.  GERD/dyspepsia -- his reflux and dyspeptic symptoms are under control with once daily pantoprazole. He will continue pantoprazole  40 mg daily for now. I like for him to liberalize his diet, from a dietary fat standpoint, prior to attempts to discontinue PPI.  Further recommendations after labs and imaging

## 2012-07-25 ENCOUNTER — Other Ambulatory Visit: Payer: BC Managed Care – PPO

## 2012-07-25 DIAGNOSIS — R634 Abnormal weight loss: Secondary | ICD-10-CM

## 2012-07-25 DIAGNOSIS — R109 Unspecified abdominal pain: Secondary | ICD-10-CM

## 2012-07-26 ENCOUNTER — Telehealth: Payer: Self-pay | Admitting: Gastroenterology

## 2012-07-26 NOTE — Telephone Encounter (Signed)
lvm for pt to call me back regarding the CT Dr. Rhea Belton ordered and the pt cancelled.

## 2012-07-31 ENCOUNTER — Other Ambulatory Visit: Payer: BC Managed Care – PPO

## 2012-08-22 ENCOUNTER — Telehealth: Payer: Self-pay | Admitting: Internal Medicine

## 2012-08-22 DIAGNOSIS — R6881 Early satiety: Secondary | ICD-10-CM

## 2012-08-22 DIAGNOSIS — R634 Abnormal weight loss: Secondary | ICD-10-CM

## 2012-08-22 DIAGNOSIS — R140 Abdominal distension (gaseous): Secondary | ICD-10-CM

## 2012-08-22 NOTE — Telephone Encounter (Signed)
Pt agreed to CT scheduled for 08/28/12 at 08:30am. He is NPO 4 hrs prior; drink 1st bottle at 6:30am and the 2nd at 07:30am. Pt given the number to change the appt if needed; instructions mailed to his house. Pt stated understanding.

## 2012-08-22 NOTE — Telephone Encounter (Signed)
Pt cancelled his CT scan and called back to ask exactly what Dr Rhea Belton is looking for on the CT. After a lengthy discussion, he thinks the inside of his organs need to be looked at, mainly the small bowel. We discussed a Capsule Endo and he would like for me to mail him info on the procedure- done. I told him we would send info to pre cert him for the Capsule; he states if they won't pay, he will pay cash. He also reports more wt loss; now weighs 121 lbs. Spoke with Dr Rhea Belton who states pt need the CT before a Capsule will be done. Dr pyrtle wants to make sure all other organs are OK d/t the weight loss. lmom for pt to call back.

## 2012-08-24 ENCOUNTER — Emergency Department (HOSPITAL_COMMUNITY)
Admission: EM | Admit: 2012-08-24 | Discharge: 2012-08-24 | Disposition: A | Discharge status: Home / Self Care | Payer: BC Managed Care – PPO | Attending: Emergency Medicine | Admitting: Emergency Medicine

## 2012-08-24 ENCOUNTER — Telehealth: Payer: Self-pay | Admitting: *Deleted

## 2012-08-24 ENCOUNTER — Telehealth: Payer: Self-pay | Admitting: Internal Medicine

## 2012-08-24 ENCOUNTER — Encounter (HOSPITAL_COMMUNITY): Payer: Self-pay | Admitting: *Deleted

## 2012-08-24 DIAGNOSIS — E46 Unspecified protein-calorie malnutrition: Secondary | ICD-10-CM | POA: Insufficient documentation

## 2012-08-24 DIAGNOSIS — R5383 Other fatigue: Secondary | ICD-10-CM | POA: Insufficient documentation

## 2012-08-24 DIAGNOSIS — Z79899 Other long term (current) drug therapy: Secondary | ICD-10-CM | POA: Insufficient documentation

## 2012-08-24 DIAGNOSIS — K219 Gastro-esophageal reflux disease without esophagitis: Secondary | ICD-10-CM | POA: Insufficient documentation

## 2012-08-24 DIAGNOSIS — R531 Weakness: Secondary | ICD-10-CM

## 2012-08-24 DIAGNOSIS — R5381 Other malaise: Secondary | ICD-10-CM | POA: Insufficient documentation

## 2012-08-24 LAB — BASIC METABOLIC PANEL
BUN: 11 mg/dL (ref 6–23)
Calcium: 9.5 mg/dL (ref 8.4–10.5)
GFR calc Af Amer: >90 mL/min (ref >90)
GFR calc non Af Amer: >90 mL/min (ref >90)
Glucose, Bld: 90 mg/dL (ref 70–99)
Sodium: 140 mEq/L (ref 135–145)

## 2012-08-24 LAB — CBC
Hemoglobin: 15.1 g/dL (ref 13.0–17.0)
MCH: 28.1 pg (ref 26.0–34.0)
MCHC: 34.2 g/dL (ref 30.0–36.0)
RDW: 12.9 % (ref 11.5–15.5)

## 2012-08-24 NOTE — ED Notes (Signed)
To ED for eval of intermittent weakness. States he has period shaking until he eats then feels normal. Under care of GI for gi issues.

## 2012-08-24 NOTE — Telephone Encounter (Signed)
Pt called to ask if he needs to go to the ER. He reports if he eats an apple or a cookie, after a few minutes he is weak again; he thinks his sugar is low. Asked pt how he feels if he eats a complex carb like bread and he states he feels better. A friend told him he needs to go to the hospital. Explained to pt he has problems with simple sugars and maybe he is hypoglycemic or insulin resistant. I asked him to eat some test and see if feels better; if not he should go to the ER. He needs to see DR Alwyn Ren and have labs done; he has an appt soon, but he will call to be seen today. He is worried about the CT and this happening, but I assured him if he sleeps until 6:30am, drinks his contrast , I'm sure there will be some type of sugar complex in the contrast; he should be OK. Pt stated understanding.

## 2012-08-24 NOTE — ED Provider Notes (Signed)
History     CSN: [id number]  Arrival date & time 08/24/12  1116   First MD Initiated Contact with Patient 08/24/12 1127      Chief Complaint  Patient presents with  . Weakness    (Consider location/radiation/quality/duration/timing/severity/associated sxs/prior treatment) HPI CC: Feels weak  Pt is a 36yo M w/ significant GI workup recently for wt loss and GERD. Pt woke up this mornning feeling weak and shaky. This improved w/ food but returned after a few hours. Pt is scheduled for CT scan on 10/29 but felt he couldn't wait that long. Pt last ate ago (approximately 300calories). No only feels weak in his arms and legs, but not shaky. Denies recent palpitations, syncope, CP, SOB, lightheadedness, n/v/d. Does endorse heartburn when he eats or drinks too much. Recently decreased his protonix to 20mg  daily. Pt has been eating smaller, more frequent meals. Not sure how many total daily calories he is eating. Pt stating there is "something wrong with his small intestine."   Pts recent workup has been sgnificant for a small hiatal hernia, mild esophagitis, and stomach polyp. Negative for parasites.   Past Medical History  Diagnosis Date  . Reflux     Past Surgical History  Procedure Date  . Tonsillectomy and adenoidectomy   . Anal wart resection     Dr Byrd Hesselbach, [facility]    Family History  Problem Relation Age of Onset  . Stroke Maternal Grandfather   . Stroke Paternal Grandfather   . Diabetes Paternal Grandfather   . Liver disease Paternal Grandmother     ? diagnosis  . Colon cancer Neg Hx     History  Substance Use Topics  . Smoking status: Never Smoker   . Smokeless tobacco: Never Used  . Alcohol Use: Yes      rarely      Review of Systems  All other systems reviewed and are negative.    Allergies  Review of patient's allergies indicates no known allergies.  Home Medications   Current Outpatient Rx  Name Route Sig Dispense Refill  . CALCIUM MAGNESIUM PO  Oral Take 1 tablet by mouth every other day.    Marland Kitchen PANTOPRAZOLE SODIUM 40 MG PO TBEC Oral Take 20 mg by mouth daily.      BP 103/68  Pulse 64  Temp 98.8 F (37.1 C) (Oral)  Resp 16  Ht 5\' 11"  (1.803 m)  Wt 123 lb 8 oz (56.019 kg)  BMI 17.22 kg/m2  SpO2 97%  Physical Exam  Nursing note and vitals reviewed. Constitutional: He is oriented to person, place, and time. He appears well-developed and well-nourished. No distress.  HENT:  Head: Normocephalic and atraumatic.  Eyes: Pupils are equal, round, and reactive to light.  Neck: Normal range of motion.  Cardiovascular: Normal rate, regular rhythm, normal heart sounds and intact distal pulses.   Pulmonary/Chest: Effort normal and breath sounds normal. No respiratory distress.  Abdominal: Soft. Normal appearance and bowel sounds are normal. He exhibits no distension and no mass. There is no tenderness. There is no rebound and no guarding.  Musculoskeletal: Normal range of motion. He exhibits no edema and no tenderness.       Very thin. Low body fat  Neurological: He is alert and oriented to person, place, and time. No cranial nerve deficit.  Skin: Skin is warm and dry. No rash noted.  Psychiatric: He has a normal mood and affect. His behavior is normal.       Denies any  food aversion, thoughts of being overweight    ED Course  Procedures (including critical care time)   Labs Reviewed  CBC  BASIC METABOLIC PANEL   No results found.   No diagnosis found.   Pt wt of 123.8 was w/ pants and shoes.    MDM  37yo M w/ persistent wt loss and weakness likely secondary to total calorie malnutrition vs GI malabsorption. Reviewed Previous studies and notes by Dr. Sherald Barge office. Workup in ED normal. Pt week feeling improved w/ food while in ED.  - Continue w/ treatment plan per Dr. Lauro Franklin office, including CT on 08/28/12 - Increase protonix to 40mg  daily.  - Keep meticulous daily records of calorie intake and on daily activities  (exercise, rest), and on daily am weights. - f/u w/ Dr. Rhea Belton as needed.   Shelly Flatten, MD Family Medicine PGY-2 08/24/2012, 2:27 PM         Ozella Rocks, MD 08/24/12 [phone]

## 2012-08-24 NOTE — Telephone Encounter (Signed)
Message copied by Florene Glen on Fri Aug 24, 2012  4:33 PM ------      Message from: [facility], [facility]      Created: Thu Aug 23, 2012 11:18 AM       Patient just called and wants his records transferred to Dr Loreta Ave for a second opinion states that he still wants to get CT that Dr Rhea Belton ordered.  Just FYI

## 2012-08-24 NOTE — ED Provider Notes (Signed)
I saw and evaluated the patient, reviewed the resident's note and I agree with the findings and plan. abd soft nontender. Advised to f/u with GI/PMD. Return for worsening symptoms or concerns  Loren Racer, MD 08/24/12 1556

## 2012-08-28 ENCOUNTER — Ambulatory Visit (INDEPENDENT_AMBULATORY_CARE_PROVIDER_SITE_OTHER): Payer: BC Managed Care – PPO | Admitting: Family Medicine

## 2012-08-28 ENCOUNTER — Telehealth: Payer: Self-pay | Admitting: Internal Medicine

## 2012-08-28 ENCOUNTER — Ambulatory Visit (INDEPENDENT_AMBULATORY_CARE_PROVIDER_SITE_OTHER)
Admission: RE | Admit: 2012-08-28 | Discharge: 2012-08-28 | Disposition: A | Discharge status: Home / Self Care | Payer: BC Managed Care – PPO | Source: Ambulatory Visit | Attending: Internal Medicine | Admitting: Internal Medicine

## 2012-08-28 VITALS — BP 97/63 | HR 64 | Temp 98.7°F | Resp 16 | Ht 70.5 in | Wt 128.0 lb

## 2012-08-28 DIAGNOSIS — R141 Gas pain: Secondary | ICD-10-CM

## 2012-08-28 DIAGNOSIS — R140 Abdominal distension (gaseous): Secondary | ICD-10-CM

## 2012-08-28 DIAGNOSIS — K219 Gastro-esophageal reflux disease without esophagitis: Secondary | ICD-10-CM

## 2012-08-28 DIAGNOSIS — R6881 Early satiety: Secondary | ICD-10-CM

## 2012-08-28 DIAGNOSIS — Q383 Other congenital malformations of tongue: Secondary | ICD-10-CM

## 2012-08-28 DIAGNOSIS — R634 Abnormal weight loss: Secondary | ICD-10-CM

## 2012-08-28 MED ORDER — IOHEXOL 300 MG/ML  SOLN
100.0000 mL | Freq: Once | INTRAMUSCULAR | Status: AC | PRN
  Administered 2012-08-28: 100 mL via INTRAVENOUS

## 2012-08-28 NOTE — Progress Notes (Signed)
 [facility]:  Office Visit  Chief Complaint:  Chief Complaint  Patient presents with  . Thrush    question if he has it    HPI: Marc Bates is a 36 y.o. male who complains of tongue lesion since March. Comes and goes depending on the type of food he eats. He has also had severe GERD and acid that comes up his mouth and he has had what sounds like esophagitis/gastritis  based on an EGD. He has had significant weight loss due to this. Has not taken any new meds, has changed his diet and is eliminating foods that make his GERD worse, his diet has changed significantly so he is not sure if this is affecting his tongue. The tongue has gotten progressively better since March. He notices it more when he is stressed or has GERD sxs ie acid in his mouth.   Based on chart review he has been to a GI doctor for his GERD sxs, EGD was don that showed gastritis/esophagitis. He does not have celiac disease He was also tested for Vitamin B12 def and that was negative He was tested for HIV and that was negative His most recent CBC and CMP on 08/14/2012 were all normal.   Past Medical History  Diagnosis Date  . Reflux   . GERD (gastroesophageal reflux disease)    Past Surgical History  Procedure Date  . Tonsillectomy and adenoidectomy   . Anal wart resection     Dr Byrd Hesselbach, [facility]   History   Social History  . Marital Status: Single    Spouse Name: N/A    Number of Children: 0  . Years of Education: N/A   Occupational History  . self employed    Social History Main Topics  . Smoking status: Never Smoker   . Smokeless tobacco: Never Used  . Alcohol Use: Yes      rarely  . Drug Use: No  . Sexually Active: None   Other Topics Concern  . None   Social History Narrative  . None   Family History  Problem Relation Age of Onset  . Stroke Maternal Grandfather   . Stroke Paternal Grandfather   . Diabetes Paternal Grandfather   . Liver disease Paternal Grandmother     ? diagnosis  . Colon cancer Neg Hx    No Known Allergies Prior to Admission medications   Medication Sig Start Date End Date Taking? Authorizing Provider  Calcium-Magnesium-Vitamin D (CALCIUM MAGNESIUM PO) Take 1 tablet by mouth every other day.   Yes Historical Provider, MD  pantoprazole (PROTONIX) 40 MG tablet Take 20 mg by mouth daily. 07/13/12  Yes Beverley Fiedler, MD     ROS: The patient denies fevers, chills, night sweats, unintentional weight loss, chest pain, palpitations, wheezing, dyspnea on exertion, nausea, vomiting, abdominal pain, dysuria, hematuria, melena, numbness, weakness, or tingling.   All other systems have been reviewed and were otherwise negative with the exception of those mentioned in the HPI and as above.    PHYSICAL EXAM: Filed Vitals:   08/28/12 1924  BP: 97/63  Pulse: 64  Temp: 98.7 F (37.1 C)  Resp: 16   Filed Vitals:   08/28/12 1924  Height: 5' 10.5" (1.791 m)  Weight: 128 lb (58.06 kg)   Body mass index is 18.11 kg/(m^2).  General: Alert, no acute distress, thin anxious white male HEENT:  Normocephalic, atraumatic, oropharynx patent. Geographic like tongue, primarily at the back, papillae in back denuded Cardiovascular:  Regular rate and rhythm, no rubs murmurs or gallops.  No Carotid bruits, radial pulse intact. No pedal edema.  Respiratory: Clear to auscultation bilaterally.  No wheezes, rales, or rhonchi.  No cyanosis, no use of accessory musculature GI: No organomegaly, abdomen is soft and non-tender, positive bowel sounds.  No masses. Skin: No rashes. Neurologic: Facial musculature symmetric. Psychiatric: Patient is appropriate throughout our interaction. Lymphatic: No cervical lymphadenopathy Musculoskeletal: Gait intact.   LABS: Results for orders placed during the hospital encounter of 08/24/12  CBC      Component Value Range   WBC 5.5  4.0 - 10.5 K/uL   RBC 5.37  4.22 - 5.81 MIL/uL   Hemoglobin 15.1  13.0 - 17.0 g/dL   HCT 66.4   40.3 - 47.4 %   MCV 82.1  78.0 - 100.0 fL   MCH 28.1  26.0 - 34.0 pg   MCHC 34.2  30.0 - 36.0 g/dL   RDW 25.9  56.3 - 87.5 %   Platelets 215  150 - 400 K/uL  BASIC METABOLIC PANEL      Component Value Range   Sodium 140  135 - 145 mEq/L   Potassium 3.9  3.5 - 5.1 mEq/L   Chloride 102  96 - 112 mEq/L   CO2 29  19 - 32 mEq/L   Glucose, Bld 90  70 - 99 mg/dL   BUN 11  6 - 23 mg/dL   Creatinine, Ser 6.43  0.50 - 1.35 mg/dL   Calcium 9.5  8.4 - 32.9 mg/dL   GFR calc non Af Amer >90  >90 mL/min   GFR calc Af Amer >90  >90 mL/min     EKG/XRAY:   Primary read interpreted by Dr. Conley Rolls at [facility].   ASSESSMENT/PLAN: Encounter Diagnoses  Name Primary?  . GERD (gastroesophageal reflux disease) Yes  . Tongue anomaly    There is evidence of denuding of the papillae of the posterior tongue. It does not appear to be thrush like to me. It is consistent with his GERD sxs, according to the patient the tongue plaques have improved as his GERD has improved. I think this is a reflection of less acid getting up the esophagus and into his mouth.  Looking at his records  I do not think he has any immuno supression based on his recent and past labs: CBC and CMP nl, HIV negative.  Will try PPI daily and Zantac prn for better GERD control  Lysine OTC  F/u prn. ? Consider a trial of nystatin if sxs persist.      ,  PHUONG, DO 08/28/2012 8:59 PM

## 2012-08-29 NOTE — Telephone Encounter (Signed)
Informed pt of results of the scan; he would like a copy mailed to him- done. He wanted to know what Dr Lauro Franklin next step would be and I informed him I only asked for the interpretation of the CT, not what to do next, but I can ask him. I asked if he was still going to Dr Loreta Ave and he stated he has asked Med [facility] for his chart info. He thanked [place].

## 2012-08-29 NOTE — Telephone Encounter (Signed)
CT results were reviewed in very reassuring. When he was last seen in clinic abdominal bloating was an issue, please see if this is still an issue I've encouraged him at last visit to liberalize his diet to ensure he is getting appropriate caloric intake If bloating continues to be a big issue we could empirically treat for small bowel bacterial overgrowth with rifaximin 550 mg 3 times a day x7 days

## 2012-08-29 NOTE — Telephone Encounter (Signed)
lmom for pt to call back. Per Dr Rhea Belton, scan is normal. There is a small cyst like area in the liver that is nothing to worry about. Is he still considering a 2nd opinion to Dr Loreta Ave?

## 2012-08-30 NOTE — Telephone Encounter (Signed)
Informed pt of Dr Lauro Franklin recommendations and orders. Pt reports he is no longer bloated, but he will call back if it arises again. He was encouraged to call the Nutritionist for another appt, he offered; he will call back for problems.

## 2012-09-03 ENCOUNTER — Telehealth: Payer: Self-pay | Admitting: Internal Medicine

## 2012-09-03 NOTE — Telephone Encounter (Signed)
Message copied by Verner Chol on Mon Sep 03, 2012 11:17 AM ------      Message from: Pecola Lawless      Created: Mon Sep 03, 2012  6:55 AM       I've been asked to fill out a form for home care; follow-up appointment needed is last seen March 2013.

## 2012-09-03 NOTE — Telephone Encounter (Signed)
No that is great

## 2012-09-03 NOTE — Telephone Encounter (Signed)
PT has CPE scheduled for 11.7.13 at 10:30am will pt need another appt for forms?

## 2012-09-06 ENCOUNTER — Ambulatory Visit (INDEPENDENT_AMBULATORY_CARE_PROVIDER_SITE_OTHER): Payer: BC Managed Care – PPO | Admitting: Internal Medicine

## 2012-09-06 ENCOUNTER — Encounter: Payer: Self-pay | Admitting: Internal Medicine

## 2012-09-06 VITALS — BP 100/68 | HR 76 | Temp 97.7°F | Resp 12 | Ht 71.08 in | Wt 133.0 lb

## 2012-09-06 DIAGNOSIS — Z0000 Encounter for general adult medical examination without abnormal findings: Secondary | ICD-10-CM

## 2012-09-06 LAB — TSH: TSH: 2.26 u[IU]/mL (ref 0.35–5.50)

## 2012-09-06 LAB — LIPID PANEL
HDL: 48.2 mg/dL (ref >39.00)
VLDL: 43 mg/dL — ABNORMAL HIGH (ref 0.0–40.0)

## 2012-09-06 NOTE — Patient Instructions (Addendum)
Preventive Health Care: Exercise at least 30-45 minutes a day,  3-4 days a week.  Eat a low-fat diet with lots of fruits and vegetables, up to 7-9 servings per day.  Health Care Power of Attorney & Living Will. Complete if not in place ; these place you in charge of your health care decisions.  If you activate My Chart; the results can be released to you as soon as they populate from the lab. If you choose not to use this program; the labs have to be reviewed, copied & mailed   causing a delay in getting the results to you.  

## 2012-09-06 NOTE — Progress Notes (Signed)
Subjective:    Patient ID: Marc Bates, male    DOB: 31-Mar-1976, 36 y.o.   MRN: [id number]  HPI  Marc Bates is here for a physical;acute issues include ongoing GI issues.      Review of Systems The extensive GI evaluation was reviewed. His bloating has improved significantly and he has gained 5 pounds in the past 2 weeks. He stays on a low-fat diet. Dr. Rhea Belton has recommended focus on caloric intake; he has been seen a Nutritionist once. He is not beveled to identify any food triggers for the bloating. He does avoid dairy. He denies any extrinsic symptoms or history of angioedema. He inquires as to possible hypomagnesemia related to his PPI based on his reading. Lab results were reviewed and copies provided; an update of the TSH would be indicated as it was last done in March. Dr. Tomasa Rand, psychiatrist, has suggested a diagnosis of anorexia. He is awaiting notification of a followup appointment with the treatment center in [facility]. At the nadir of his symptoms 2 weeks ago, he states he felt physically and emotionally " overwhelmed" and unable to perform activities of daily living, especially cooking. [facility] was contacted for short-term involvement.       Objective:   Physical Exam Gen.: Thin but healthy and well-nourished in appearance. Alert, appropriate and cooperative throughout exam. Head: Normocephalic without obvious abnormalities;  no alopecia  Eyes: No corneal or conjunctival inflammation noted. Pupils equal round reactive to light and accommodation. Fundal exam is benign without hemorrhages, exudate, papilledema. Extraocular motion intact. Vision grossly normal with lenses. Ears: External  ear exam reveals no significant lesions or deformities. Canals clear .TMs normal. Hearing is grossly normal bilaterally. Nose: External nasal exam reveals no deformity or inflammation. Nasal mucosa are pink and moist. No lesions or exudates noted.  Mouth: Oral mucosa and  oropharynx reveal no lesions or exudates. Teeth in good repair. Neck: No deformities, masses, or tenderness noted. Range of motion & Thyroid normal. Lungs: Normal respiratory effort; chest expands symmetrically. Lungs are clear to auscultation without rales, wheezes, or increased work of breathing. Heart: Normal rate and rhythm. Normal S1 and S2. No gallop, click, or rub. S4 w/o murmur. Abdomen: Bowel sounds normal; abdomen soft and nontender. No masses, organomegaly or hernias noted. Genitalia/DRE: Dr Rhea Belton Musculoskeletal/extremities: No deformity or scoliosis noted of  the thoracic or lumbar spine. No clubbing, cyanosis, edema, or deformity noted. Range of motion  normal .Tone & strength  normal.Joints normal. Nail health  good. Vascular: Carotid, radial artery, dorsalis pedis and  posterior tibial pulses are full and equal. No bruits present. Neurologic: Alert and oriented x3. Deep tendon reflexes symmetrical and normal.          Skin: Intact without suspicious lesions or rashes. Lymph: No cervical, axillary lymphadenopathy present. Psych: Mood and affect are normal. Normally interactive                                                                                         Assessment & Plan:  #1 comprehensive physical exam; no acute findings #2 bloating & weight loss, improved. R/O anorexia Plan: see Orders

## 2012-09-10 ENCOUNTER — Encounter: Payer: Self-pay | Admitting: *Deleted

## 2012-09-11 ENCOUNTER — Telehealth: Payer: Self-pay

## 2012-09-11 NOTE — Telephone Encounter (Signed)
Pt called asking for results. I advised lab results from 09/06/12. Mailed pt educational material on lowering triglycerides. Pt states understanding.      MW

## 2012-09-12 ENCOUNTER — Encounter: Payer: Self-pay | Admitting: Internal Medicine

## 2012-09-12 NOTE — Telephone Encounter (Signed)
Duplicate   KP 

## 2012-10-03 ENCOUNTER — Ambulatory Visit: Payer: BC Managed Care – PPO | Admitting: Psychology

## 2013-07-08 IMAGING — CR DG ABDOMEN 1V
1 series · 1 of 1 positions shown · non-contrast
Comparison: None.

CLINICAL DATA: Abdominal pain.

ABDOMEN - 1 VIEW

[t abdomen supine]
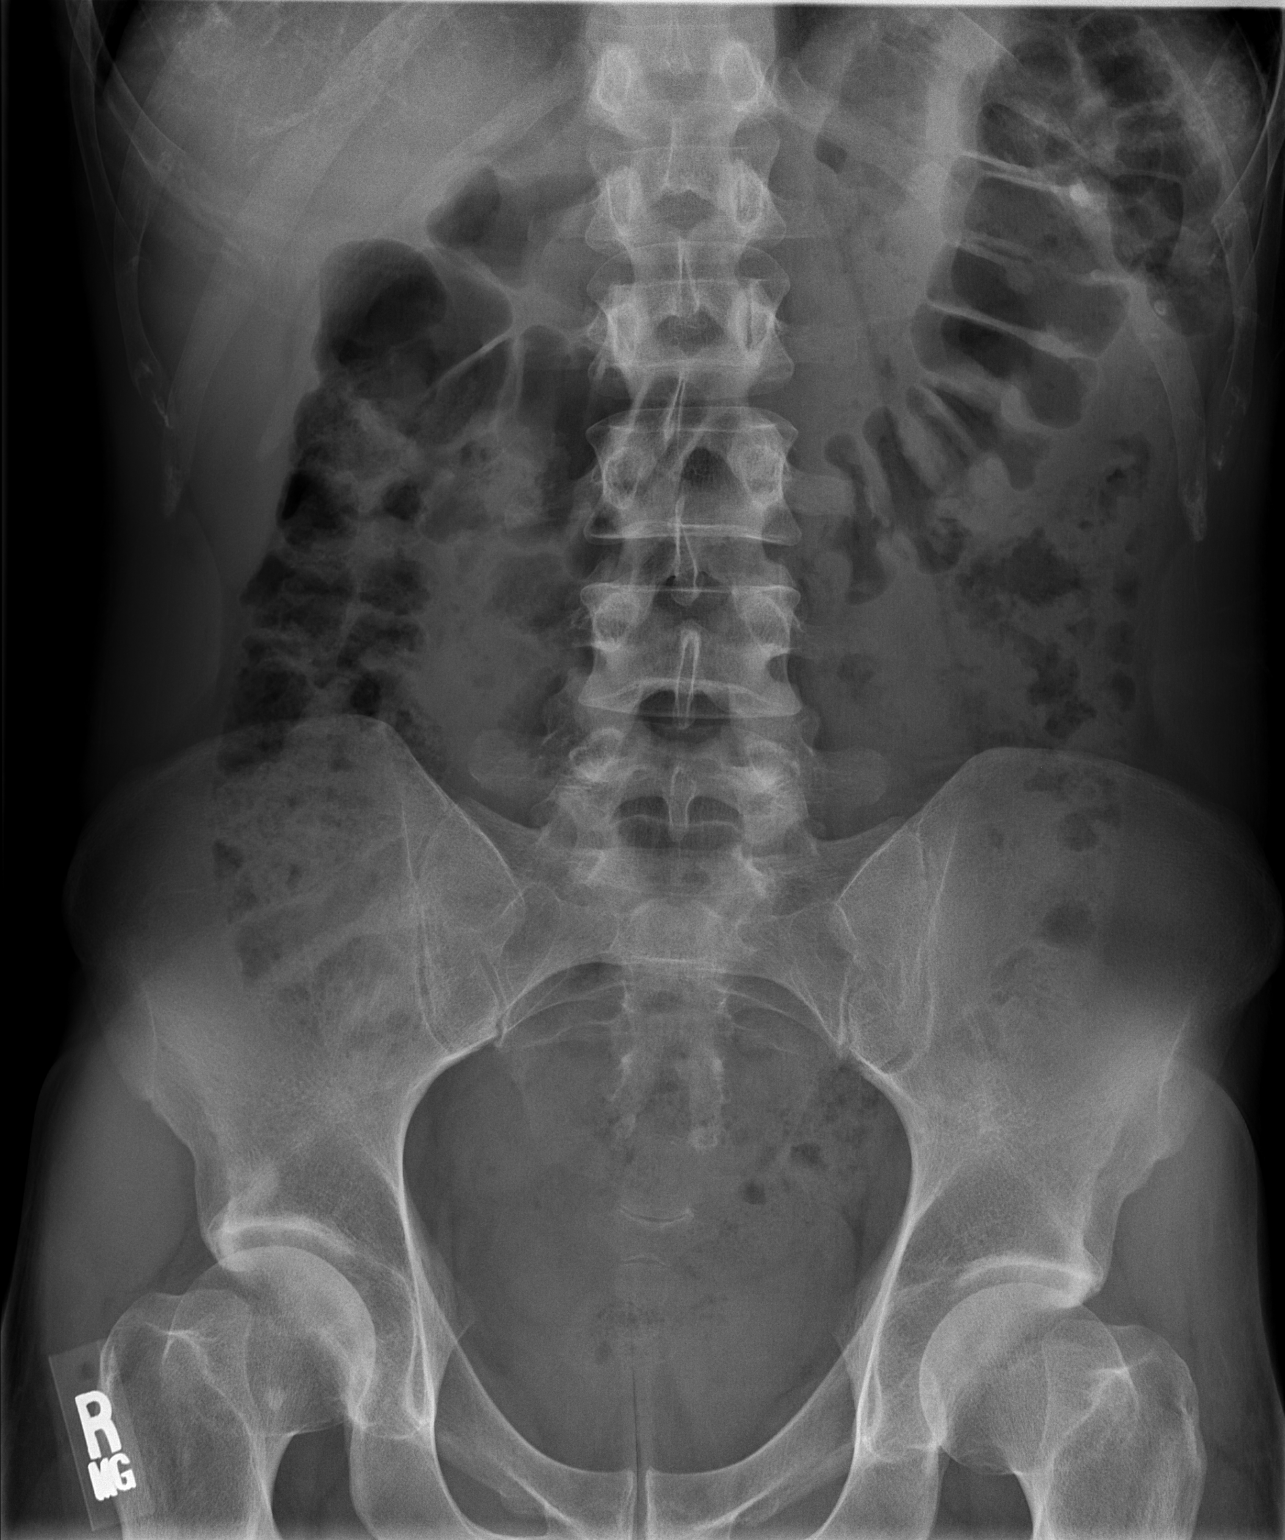

[1 of 1 positions shown; findings below may reference images not displayed]

FINDINGS: The bowel gas pattern is unremarkable.  There is
scattered air and stool in the colon and scattered small bowel
loops with air but no distention.  The soft tissue shadows are
maintained.  No worrisome calcifications.  The bony structures are
intact.
IMPRESSION: Unremarkable abdominal radiograph.

## 2013-09-05 ENCOUNTER — Other Ambulatory Visit: Payer: Self-pay

## 2013-09-22 IMAGING — US US ABDOMEN COMPLETE
1 series · 14 of 25 positions shown · non-contrast
Comparison: No priors.

CLINICAL DATA: Epigastric pain.

COMPLETE ABDOMINAL ULTRASOUND

[Series 1: us abdomen complete · 0.28mm/px · 14 of 95 slices shown]
[im 1/95]
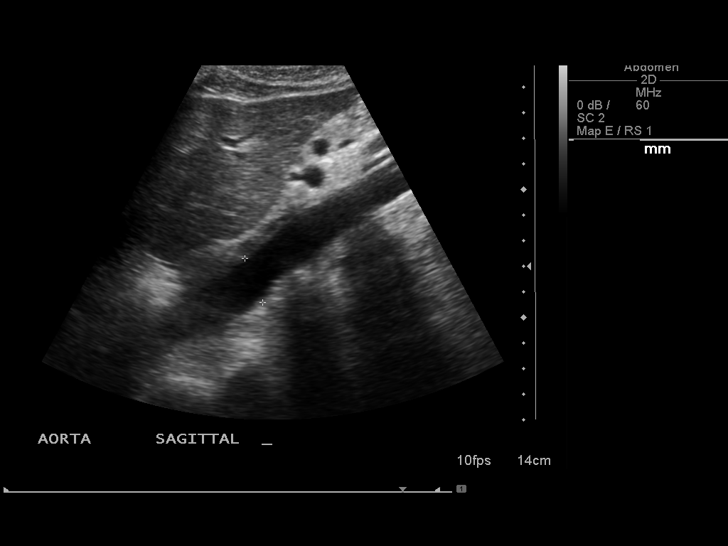
[im 8/95]
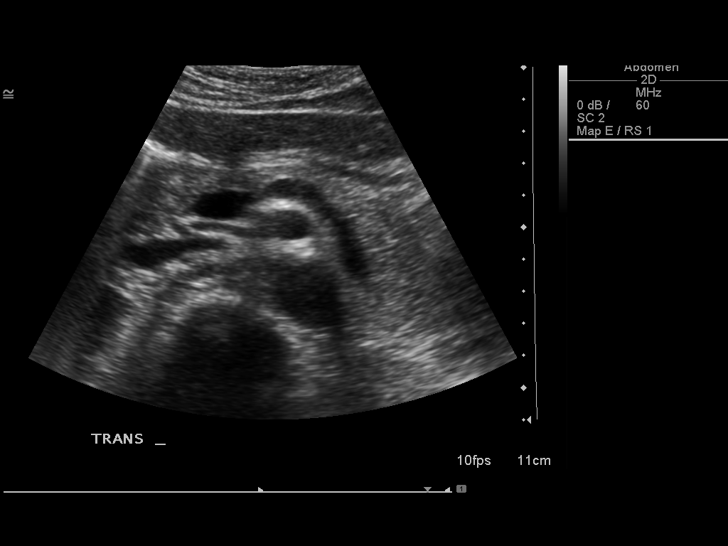
[im 16/95]
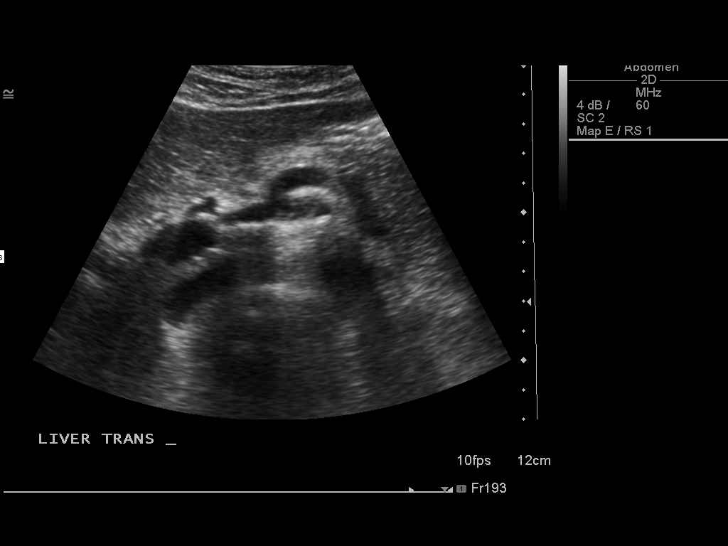
[im 24/95]
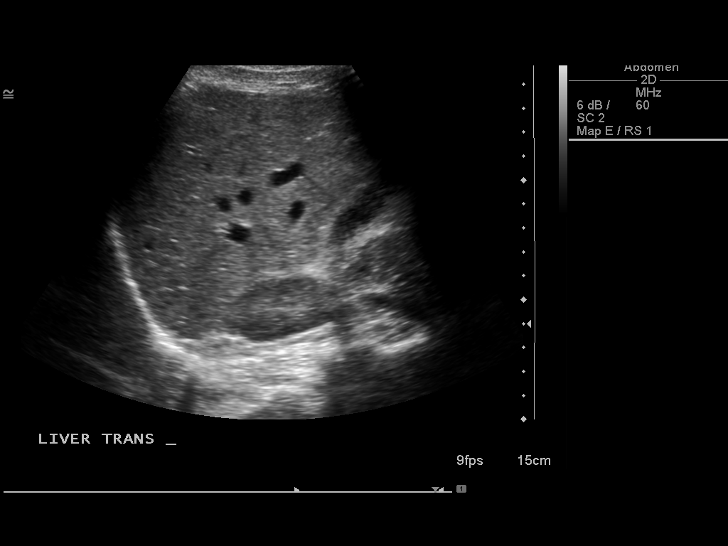
[im 32/95]
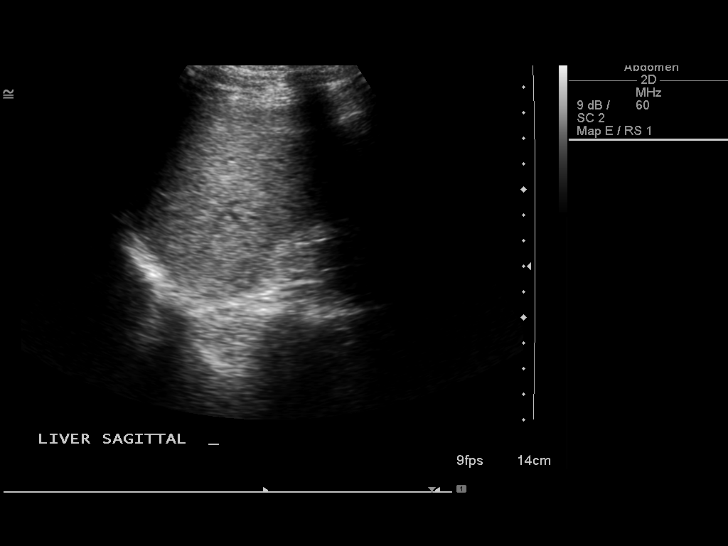
[im 36/95]
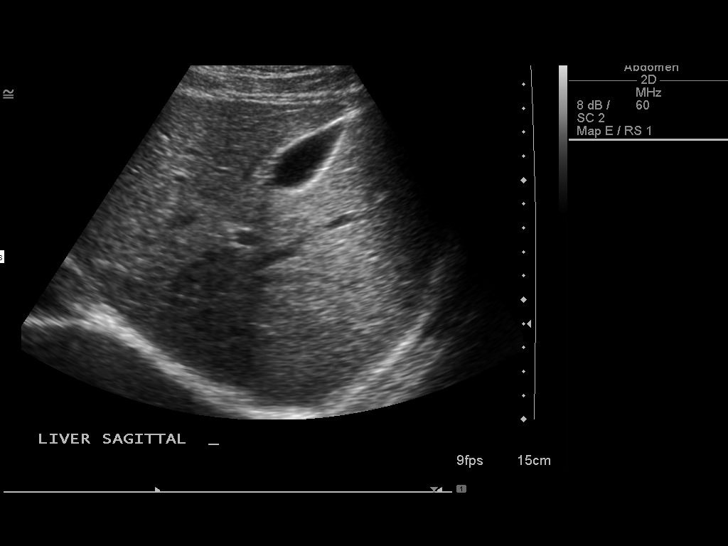
[im 44/95]
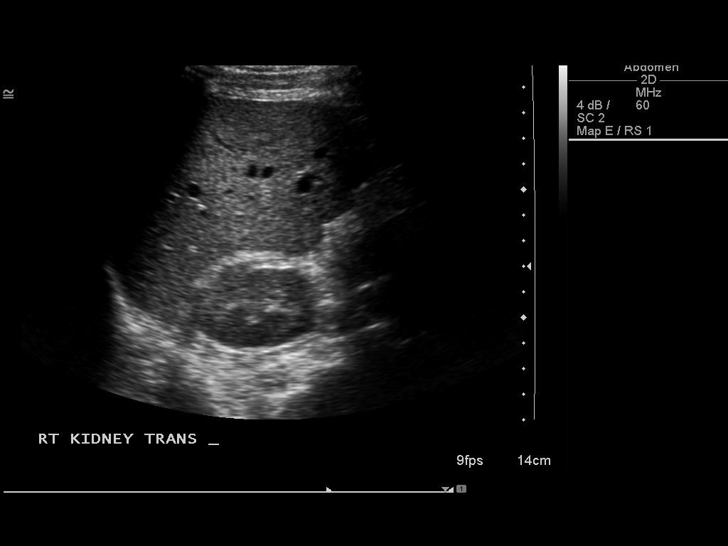
[im 51/95]
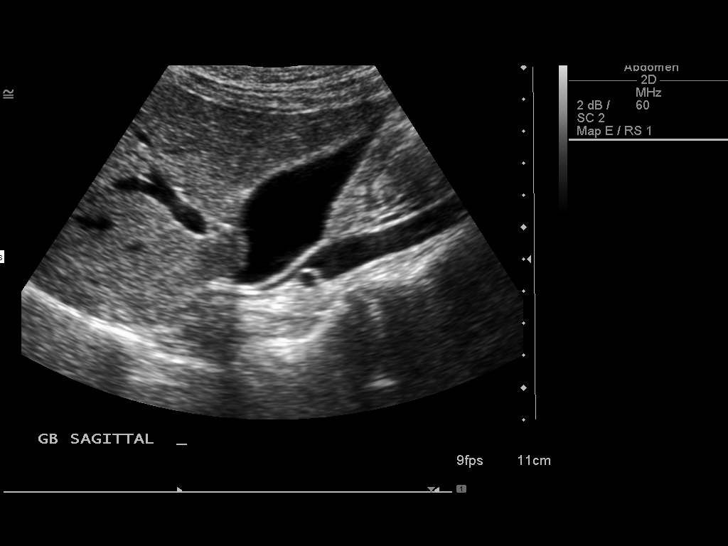
[im 59/95]
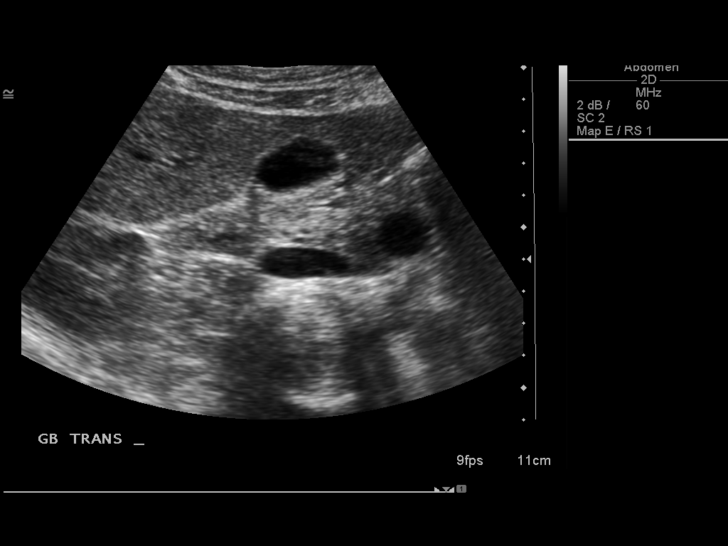
[im 63/95]
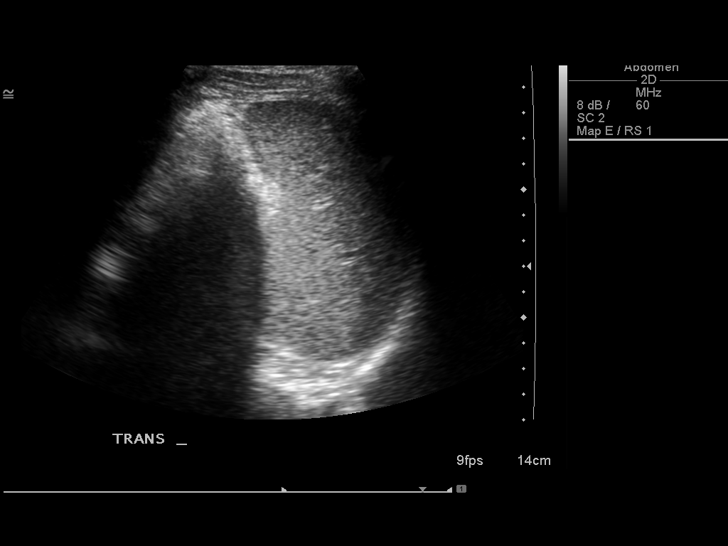
[im 71/95]
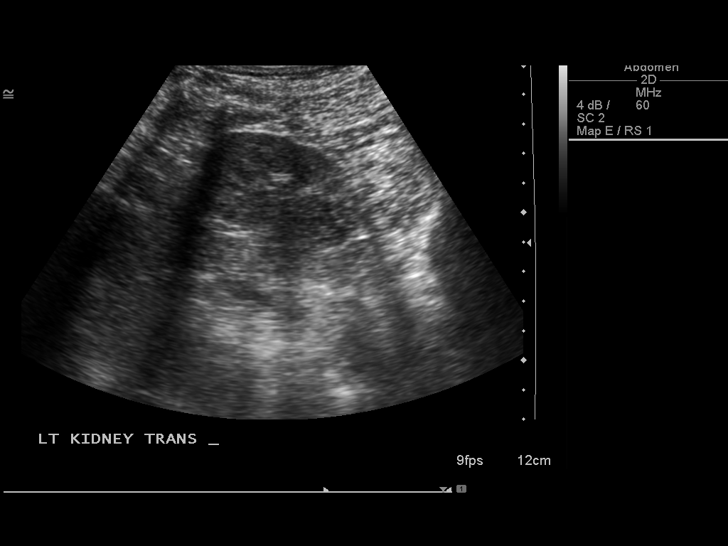
[im 79/95]
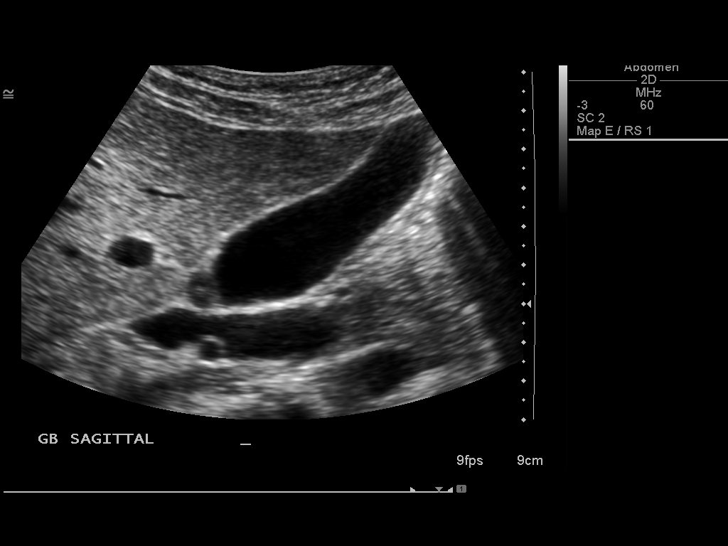
[im 87/95]
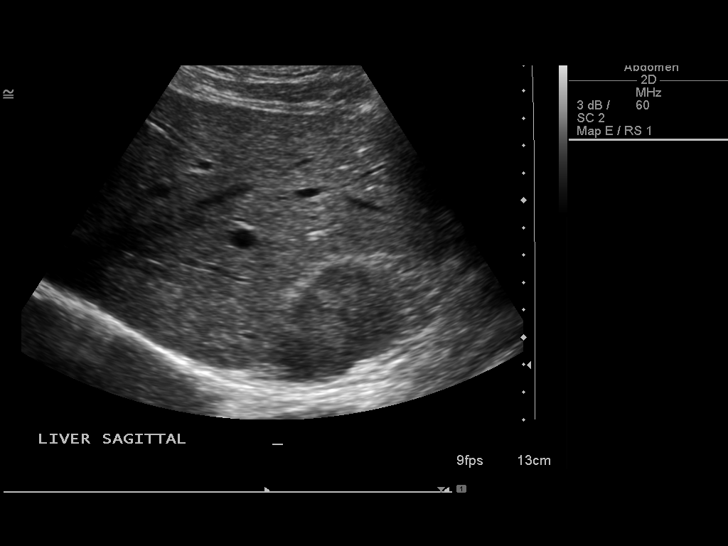
[im 95/95]
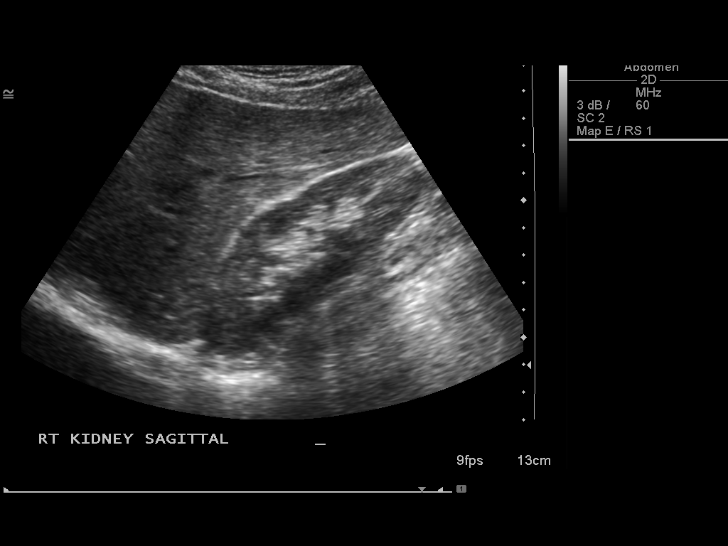

[14 of 25 positions shown; findings below may reference images not displayed]

FINDINGS: Gallbladder:  No shadowing gallstones or echogenic sludge.  No
gallbladder wall thickening or pericholecystic fluid.  Negative
sonographic Murphy's sign according to the ultrasound technologist.

Common bile duct:  1.5 mm within the porta hepatis.

Liver:  Normal size and echotexture without focal parenchymal
abnormality.  Patent portal vein with hepatopetal flow.

IVC:  Patent throughout its visualized course in the abdomen.

Pancreas:  Although the pancreas is difficult to visualize in its
entirety, no focal pancreatic abnormality is identified.

Spleen:  Normal size and echotexture without focal parenchymal
abnormality.

Right Kidney:  No hydronephrosis.  Well-preserved cortex.  Normal
size and parenchymal echotexture without focal abnormalities.

Left Kidney:  No hydronephrosis.  Well-preserved cortex.  Normal
size and parenchymal echotexture without focal abnormalities.

Abdominal aorta:  1.9 cm in diameter proximally, and tapers
appropriately distally.
IMPRESSION: Negative abdominal ultrasound.

## 2014-04-07 ENCOUNTER — Encounter: Payer: Self-pay | Admitting: Internal Medicine

## 2014-04-07 ENCOUNTER — Ambulatory Visit (INDEPENDENT_AMBULATORY_CARE_PROVIDER_SITE_OTHER): Payer: BC Managed Care – PPO | Admitting: Internal Medicine

## 2014-04-07 VITALS — BP 100/72 | HR 74 | Temp 98.2°F | Resp 12 | Ht 71.0 in | Wt 155.8 lb

## 2014-04-07 DIAGNOSIS — Z0000 Encounter for general adult medical examination without abnormal findings: Secondary | ICD-10-CM

## 2014-04-07 DIAGNOSIS — D129 Benign neoplasm of anus and anal canal: Secondary | ICD-10-CM

## 2014-04-07 DIAGNOSIS — D128 Benign neoplasm of rectum: Secondary | ICD-10-CM

## 2014-04-07 DIAGNOSIS — Z862 Personal history of diseases of the blood and blood-forming organs and certain disorders involving the immune mechanism: Secondary | ICD-10-CM | POA: Insufficient documentation

## 2014-04-07 MED ORDER — HYDROCORTISONE ACE-PRAMOXINE 1-1 % RE FOAM: 1.0000 | Freq: Two times a day (BID) | RECTAL | Status: AC

## 2014-04-07 NOTE — Patient Instructions (Signed)
Your next office appointment will be determined based upon review of your pending labs. Those instructions will be transmitted to you through My Chart . 

## 2014-04-07 NOTE — Progress Notes (Signed)
   Subjective:    Patient ID: Marc Bates, male    DOB: 1976-09-03, 38 y.o.   MRN: [id number]  HPI   He  is here for a physical;acute issues include "hemorrhoids" which are symptomatic.  PMH of anal wart resection 2006 @ [facility] by Dr Morton Stall    Review of Systems  Intermittent rectal bleeding as blood on tissue, averaging once per week.  Unexplained weight loss, abdominal pain, significant dyspepsia, dysphagia, melena, or persistently small caliber stools are denied.      Objective:   Physical Exam Gen.: Thin but healthy and well-nourished in appearance. Alert, appropriate and cooperative throughout exam. Appears younger than stated age  Head: Normocephalic without obvious abnormalities;  No alopecia  Eyes: No corneal or conjunctival inflammation noted. Pupils equal round reactive to light and accommodation. Extraocular motion intact.  Ears: External  ear exam reveals no significant lesions or deformities. Canals clear .TMs normal. Hearing is grossly normal bilaterally. Nose: External nasal exam reveals no deformity or inflammation. Nasal mucosa are pink and moist. No lesions or exudates noted.   Mouth: Oral mucosa and oropharynx reveal no lesions or exudates. Teeth in good repair. Neck: No deformities, masses, or tenderness noted. Range of motion &  Thyroid normal. Lungs: Normal respiratory effort; chest expands symmetrically. Lungs are clear to auscultation without rales, wheezes, or increased work of breathing. Heart: Normal rate and rhythm. Normal S1 and S2. No gallop, click, or rub. No murmur. Abdomen: Bowel sounds normal; abdomen soft and nontender. No masses, organomegaly or hernias noted. Genitalia normal except for left varices.   Slightly tender ? Granulomatous type changes , no visulaize hemorrhoidal tissue @ anus.                           Musculoskeletal/extremities: No deformity or scoliosis noted of  the thoracic or lumbar spine.  . No clubbing, cyanosis, edema, or  significant extremity  deformity noted. Range of motion normal .Tone & strength normal. Hand joints normal Fingernail health good. Able to lie down & sit up w/o help. Negative SLR bilaterally Vascular: Carotid, radial artery, dorsalis pedis and  posterior tibial pulses are full and equal. No bruits present. Neurologic: Alert and oriented x3. Deep tendon reflexes symmetrical and normal.  Gait normal        Skin: Intact without suspicious lesions or rashes. Lymph: No cervical, axillary, or inguinal lymphadenopathy present. Psych: Mood and affect are normal. Normally interactive                                                                                        Assessment & Plan:  #1 comprehensive physical exam; no acute findings #2 perianal lesions atypical for hemorrhoids  Plan: see Orders  & Recommendations

## 2014-04-07 NOTE — Progress Notes (Signed)
Pre visit review using our clinic review tool, if applicable. No additional management support is needed unless otherwise documented below in the visit note. 

## 2014-04-08 ENCOUNTER — Other Ambulatory Visit (INDEPENDENT_AMBULATORY_CARE_PROVIDER_SITE_OTHER): Payer: BC Managed Care – PPO

## 2014-04-08 DIAGNOSIS — Z0000 Encounter for general adult medical examination without abnormal findings: Secondary | ICD-10-CM

## 2014-04-08 LAB — CBC WITH DIFFERENTIAL/PLATELET
BASOS PCT: 1 % (ref 0.0–3.0)
Basophils Absolute: 0.1 10*3/uL (ref 0.0–0.1)
EOS ABS: 0.2 10*3/uL (ref 0.0–0.7)
EOS PCT: 2.9 % (ref 0.0–5.0)
HEMATOCRIT: 42.2 % (ref 39.0–52.0)
Hemoglobin: 14.3 g/dL (ref 13.0–17.0)
LYMPHS ABS: 1.5 10*3/uL (ref 0.7–4.0)
Lymphocytes Relative: 26.9 % (ref 12.0–46.0)
MCHC: 33.9 g/dL (ref 30.0–36.0)
MCV: 81.5 fl (ref 78.0–100.0)
MONO ABS: 0.6 10*3/uL (ref 0.1–1.0)
Monocytes Relative: 11.6 % (ref 3.0–12.0)
NEUTROS ABS: 3.2 10*3/uL (ref 1.4–7.7)
Neutrophils Relative %: 57.6 % (ref 43.0–77.0)
Platelets: 233 10*3/uL (ref 150.0–400.0)
RBC: 5.18 Mil/uL (ref 4.22–5.81)
RDW: 12.4 % (ref 11.5–15.5)
WBC: 5.5 10*3/uL (ref 4.0–10.5)

## 2014-04-08 LAB — BASIC METABOLIC PANEL
BUN: 12 mg/dL (ref 6–23)
CHLORIDE: 102 meq/L (ref 96–112)
CO2: 30 meq/L (ref 19–32)
CREATININE: 1 mg/dL (ref 0.4–1.5)
Calcium: 9.2 mg/dL (ref 8.4–10.5)
GFR: 92.22 mL/min (ref >60.00)
Glucose, Bld: 82 mg/dL (ref 70–99)
POTASSIUM: 3.6 meq/L (ref 3.5–5.1)
Sodium: 138 mEq/L (ref 135–145)

## 2014-04-08 LAB — HEPATIC FUNCTION PANEL
ALBUMIN: 4.1 g/dL (ref 3.5–5.2)
ALT: 12 U/L (ref 0–53)
AST: 17 U/L (ref 0–37)
Alkaline Phosphatase: 68 U/L (ref 39–117)
Bilirubin, Direct: 0.2 mg/dL (ref 0.0–0.3)
TOTAL PROTEIN: 7.1 g/dL (ref 6.0–8.3)
Total Bilirubin: 1 mg/dL (ref 0.2–1.2)

## 2014-04-08 LAB — LIPID PANEL
CHOL/HDL RATIO: 3
CHOLESTEROL: 131 mg/dL (ref 0–200)
HDL: 42.9 mg/dL (ref >39.00)
LDL Cholesterol: 77 mg/dL (ref 0–99)
NonHDL: 88.1
TRIGLYCERIDES: 56 mg/dL (ref 0.0–149.0)
VLDL: 11.2 mg/dL (ref 0.0–40.0)

## 2014-04-08 LAB — IBC PANEL
IRON: 68 ug/dL (ref 42–165)
SATURATION RATIOS: 20.8 % (ref 20.0–50.0)
TRANSFERRIN: 233.3 mg/dL (ref 212.0–360.0)

## 2014-04-08 LAB — TSH: TSH: 4.61 u[IU]/mL — ABNORMAL HIGH (ref 0.35–4.50)

## 2014-04-13 ENCOUNTER — Other Ambulatory Visit: Payer: Self-pay | Admitting: Internal Medicine

## 2014-04-13 ENCOUNTER — Encounter: Payer: Self-pay | Admitting: Internal Medicine

## 2014-04-13 DIAGNOSIS — R946 Abnormal results of thyroid function studies: Secondary | ICD-10-CM | POA: Insufficient documentation

## 2014-04-29 ENCOUNTER — Encounter: Payer: Self-pay | Admitting: Internal Medicine
